# Patient Record
Sex: Female | Born: 1945
Health system: Southern US, Community
[De-identification: ages and names within clinical notes are randomized; demographics above are authoritative.]

## PROBLEM LIST (undated history)

## (undated) DIAGNOSIS — E0500 Thyrotoxicosis with diffuse goiter without thyrotoxic crisis or storm: Secondary | ICD-10-CM

## (undated) DIAGNOSIS — I10 Essential (primary) hypertension: Secondary | ICD-10-CM

## (undated) DIAGNOSIS — J45909 Unspecified asthma, uncomplicated: Secondary | ICD-10-CM

## (undated) DIAGNOSIS — E0590 Thyrotoxicosis, unspecified without thyrotoxic crisis or storm: Secondary | ICD-10-CM

## (undated) HISTORY — PX: HEMORROIDECTOMY: SUR656

## (undated) HISTORY — PX: TONSILLECTOMY: SUR1361

## (undated) HISTORY — PX: TONSILLECTOMY AND ADENOIDECTOMY: SHX28

## (undated) HISTORY — PX: EYE SURGERY: SHX253

---

## 1998-07-15 ENCOUNTER — Encounter: Payer: Self-pay | Admitting: Obstetrics and Gynecology

## 1998-07-15 ENCOUNTER — Ambulatory Visit (HOSPITAL_COMMUNITY): Admission: RE | Admit: 1998-07-15 | Discharge: 1998-07-15 | Payer: Self-pay | Admitting: Obstetrics and Gynecology

## 1998-10-15 ENCOUNTER — Other Ambulatory Visit: Admission: RE | Admit: 1998-10-15 | Discharge: 1998-10-15 | Payer: Self-pay | Admitting: Obstetrics and Gynecology

## 1999-08-15 ENCOUNTER — Ambulatory Visit (HOSPITAL_COMMUNITY): Admission: RE | Admit: 1999-08-15 | Discharge: 1999-08-15 | Payer: Self-pay | Admitting: Obstetrics and Gynecology

## 1999-08-15 ENCOUNTER — Encounter: Payer: Self-pay | Admitting: Obstetrics and Gynecology

## 2000-05-07 ENCOUNTER — Other Ambulatory Visit: Admission: RE | Admit: 2000-05-07 | Discharge: 2000-05-07 | Payer: Self-pay | Admitting: Obstetrics and Gynecology

## 2000-11-20 ENCOUNTER — Ambulatory Visit (HOSPITAL_COMMUNITY): Admission: RE | Admit: 2000-11-20 | Discharge: 2000-11-20 | Payer: Self-pay | Admitting: Obstetrics and Gynecology

## 2000-11-20 ENCOUNTER — Encounter: Payer: Self-pay | Admitting: Obstetrics and Gynecology

## 2000-11-23 ENCOUNTER — Encounter: Admission: RE | Admit: 2000-11-23 | Discharge: 2000-11-23 | Payer: Self-pay | Admitting: Obstetrics and Gynecology

## 2000-11-23 ENCOUNTER — Encounter: Payer: Self-pay | Admitting: Obstetrics and Gynecology

## 2001-11-22 ENCOUNTER — Encounter: Payer: Self-pay | Admitting: Obstetrics and Gynecology

## 2001-11-22 ENCOUNTER — Ambulatory Visit (HOSPITAL_COMMUNITY): Admission: RE | Admit: 2001-11-22 | Discharge: 2001-11-22 | Payer: Self-pay | Admitting: Obstetrics and Gynecology

## 2001-12-02 ENCOUNTER — Other Ambulatory Visit: Admission: RE | Admit: 2001-12-02 | Discharge: 2001-12-02 | Payer: Self-pay | Admitting: Obstetrics and Gynecology

## 2002-07-08 ENCOUNTER — Emergency Department (HOSPITAL_COMMUNITY): Admission: EM | Admit: 2002-07-08 | Discharge: 2002-07-08 | Payer: Self-pay | Admitting: Emergency Medicine

## 2002-07-08 ENCOUNTER — Encounter: Payer: Self-pay | Admitting: Emergency Medicine

## 2002-08-22 ENCOUNTER — Encounter: Payer: Self-pay | Admitting: Surgery

## 2002-08-23 ENCOUNTER — Inpatient Hospital Stay (HOSPITAL_COMMUNITY): Admission: RE | Admit: 2002-08-23 | Discharge: 2002-08-24 | Payer: Self-pay | Admitting: Surgery

## 2003-01-09 ENCOUNTER — Encounter: Payer: Self-pay | Admitting: Obstetrics and Gynecology

## 2003-01-09 ENCOUNTER — Ambulatory Visit (HOSPITAL_COMMUNITY): Admission: RE | Admit: 2003-01-09 | Discharge: 2003-01-09 | Payer: Self-pay | Admitting: Obstetrics and Gynecology

## 2003-06-28 ENCOUNTER — Emergency Department (HOSPITAL_COMMUNITY): Admission: EM | Admit: 2003-06-28 | Discharge: 2003-06-28 | Payer: Self-pay | Admitting: Emergency Medicine

## 2004-02-05 ENCOUNTER — Ambulatory Visit (HOSPITAL_COMMUNITY): Admission: RE | Admit: 2004-02-05 | Discharge: 2004-02-05 | Payer: Self-pay | Admitting: Obstetrics and Gynecology

## 2004-02-22 ENCOUNTER — Other Ambulatory Visit: Admission: RE | Admit: 2004-02-22 | Discharge: 2004-02-22 | Payer: Self-pay | Admitting: Obstetrics and Gynecology

## 2005-04-07 ENCOUNTER — Other Ambulatory Visit: Admission: RE | Admit: 2005-04-07 | Discharge: 2005-04-07 | Payer: Self-pay | Admitting: Obstetrics and Gynecology

## 2005-04-14 ENCOUNTER — Ambulatory Visit (HOSPITAL_COMMUNITY): Admission: RE | Admit: 2005-04-14 | Discharge: 2005-04-14 | Payer: Self-pay | Admitting: Obstetrics and Gynecology

## 2005-09-13 ENCOUNTER — Ambulatory Visit (HOSPITAL_COMMUNITY): Admission: RE | Admit: 2005-09-13 | Discharge: 2005-09-13 | Payer: Self-pay | Admitting: Internal Medicine

## 2006-07-08 ENCOUNTER — Emergency Department (HOSPITAL_COMMUNITY): Admission: EM | Admit: 2006-07-08 | Discharge: 2006-07-08 | Payer: Self-pay | Admitting: Family Medicine

## 2018-03-13 ENCOUNTER — Other Ambulatory Visit: Payer: Self-pay | Admitting: Family Medicine

## 2018-03-13 DIAGNOSIS — Z1231 Encounter for screening mammogram for malignant neoplasm of breast: Secondary | ICD-10-CM

## 2018-03-22 ENCOUNTER — Other Ambulatory Visit: Payer: Self-pay | Admitting: Family Medicine

## 2018-03-22 DIAGNOSIS — E2839 Other primary ovarian failure: Secondary | ICD-10-CM

## 2018-05-03 ENCOUNTER — Ambulatory Visit: Payer: Self-pay

## 2018-05-03 ENCOUNTER — Other Ambulatory Visit: Payer: Self-pay

## 2018-05-29 ENCOUNTER — Other Ambulatory Visit: Payer: Self-pay

## 2018-05-29 ENCOUNTER — Ambulatory Visit: Payer: Self-pay

## 2018-07-31 ENCOUNTER — Ambulatory Visit: Payer: Self-pay

## 2018-07-31 ENCOUNTER — Other Ambulatory Visit: Payer: Self-pay

## 2018-09-23 ENCOUNTER — Ambulatory Visit: Payer: Self-pay

## 2018-09-23 ENCOUNTER — Other Ambulatory Visit: Payer: Self-pay

## 2019-01-14 ENCOUNTER — Ambulatory Visit: Payer: Self-pay

## 2019-01-14 ENCOUNTER — Other Ambulatory Visit: Payer: Self-pay

## 2019-04-08 ENCOUNTER — Other Ambulatory Visit: Payer: Self-pay | Admitting: Family Medicine

## 2019-04-08 DIAGNOSIS — Z1231 Encounter for screening mammogram for malignant neoplasm of breast: Secondary | ICD-10-CM

## 2019-04-11 ENCOUNTER — Other Ambulatory Visit: Payer: Self-pay | Admitting: Family Medicine

## 2019-04-11 DIAGNOSIS — E2839 Other primary ovarian failure: Secondary | ICD-10-CM

## 2019-04-15 ENCOUNTER — Other Ambulatory Visit: Payer: Self-pay

## 2019-04-15 ENCOUNTER — Ambulatory Visit: Payer: Self-pay

## 2019-06-05 ENCOUNTER — Ambulatory Visit: Payer: Self-pay

## 2019-06-30 ENCOUNTER — Ambulatory Visit: Payer: Self-pay

## 2019-06-30 ENCOUNTER — Other Ambulatory Visit: Payer: Self-pay

## 2019-08-31 ENCOUNTER — Ambulatory Visit: Payer: Medicare Other | Attending: Internal Medicine

## 2019-08-31 DIAGNOSIS — Z23 Encounter for immunization: Secondary | ICD-10-CM | POA: Insufficient documentation

## 2019-08-31 NOTE — Progress Notes (Signed)
   Covid-19 Vaccination Clinic  Name:  Gina Bryan    MRN: [id number] DOB: Apr 19, 1946  08/31/2019  Gina Bryan was observed post Covid-19 immunization for 15 minutes without incidence. She was provided with Vaccine Information Sheet and instruction to access the V-Safe system.   Gina Bryan was instructed to call 911 with any severe reactions post vaccine: Marland Kitchen Difficulty breathing  . Swelling of your face and throat  . A fast heartbeat  . A bad rash all over your body  . Dizziness and weakness    Immunizations Administered    Name Date Dose VIS Date Route   Pfizer COVID-19 Vaccine 08/31/2019  1:29 PM 0.3 mL 06/27/2019 Intramuscular   Manufacturer: [place]   Lot: [id number]   [place]: [id number]

## 2019-09-04 ENCOUNTER — Ambulatory Visit: Payer: Self-pay

## 2019-09-08 ENCOUNTER — Ambulatory Visit: Payer: Self-pay

## 2019-09-23 ENCOUNTER — Ambulatory Visit: Payer: Medicare Other | Attending: Internal Medicine

## 2019-09-23 DIAGNOSIS — Z23 Encounter for immunization: Secondary | ICD-10-CM | POA: Insufficient documentation

## 2019-09-23 NOTE — Progress Notes (Signed)
   Covid-19 Vaccination Clinic  Name:  Gina Bryan    MRN: [id number] DOB: 10-13-1945  09/23/2019  Ms. Gina Bryan was observed post Covid-19 immunization for 15 minutes without incident. She was provided with Vaccine Information Sheet and instruction to access the V-Safe system.   Ms. Gina Bryan was instructed to call 911 with any severe reactions post vaccine: Marland Kitchen Difficulty breathing  . Swelling of face and throat  . A fast heartbeat  . A bad rash all over body  . Dizziness and weakness   Immunizations Administered    Name Date Dose VIS Date Route   Pfizer COVID-19 Vaccine 09/23/2019  1:27 PM 0.3 mL 06/27/2019 Intramuscular   Manufacturer: [place]   Lot: [id number]   [place]: [id number]

## 2019-09-24 ENCOUNTER — Ambulatory Visit: Payer: Self-pay

## 2019-09-24 ENCOUNTER — Other Ambulatory Visit: Payer: Self-pay

## 2019-12-11 ENCOUNTER — Other Ambulatory Visit: Payer: Self-pay

## 2019-12-11 ENCOUNTER — Ambulatory Visit
Admission: RE | Admit: 2019-12-11 | Discharge: 2019-12-11 | Disposition: A | Discharge status: Home / Self Care | Payer: Medicare Other | Source: Ambulatory Visit | Attending: Family Medicine | Admitting: Family Medicine

## 2019-12-11 DIAGNOSIS — Z1231 Encounter for screening mammogram for malignant neoplasm of breast: Secondary | ICD-10-CM

## 2019-12-11 DIAGNOSIS — E2839 Other primary ovarian failure: Secondary | ICD-10-CM

## 2020-02-03 ENCOUNTER — Other Ambulatory Visit: Payer: Self-pay | Admitting: Surgery

## 2020-02-25 NOTE — Patient Instructions (Addendum)
DUE TO COVID-19 ONLY ONE VISITOR IS ALLOWED TO COME WITH YOU AND STAY IN THE WAITING ROOM ONLY DURING PRE OP AND PROCEDURE DAY OF SURGERY. THE 1 VISITOR  MAY VISIT WITH YOU AFTER SURGERY IN YOUR PRIVATE ROOM DURING VISITING HOURS ONLY!  YOU NEED TO HAVE A COVID 19 TEST ON: 03/01/20 @ 2:00 pm , THIS TEST MUST BE DONE BEFORE SURGERY,  COVID TESTING SITE [place] [id number], IT IS ON THE RIGHT GOING OUT [address] APPROXIMATELY  2 MINUTES PAST [employer] ON THE RIGHT. ONCE YOUR COVID TEST IS COMPLETED,  PLEASE BEGIN THE QUARANTINE INSTRUCTIONS AS OUTLINED IN YOUR HANDOUT.                Assunta Curtis    Your procedure is scheduled on: 03/04/20   Report to [facility] Main  Entrance   Report to admitting at: 7:00 AM     Call this number if you have problems the morning of surgery [phone]    Remember:   DRINK 2 [place] AT  1000 PM AND 1 PRESURGERY DRINK THE DAY OF THE PROCEDURE 3 HOURS PRIOR TO SCHEDULED SURGERY. NO SOLIDS AFTER MIDNIGHT THE DAY PRIOR TO THE SURGERY. NOTHING BY MOUTH EXCEPT CLEAR LIQUIDS UNTIL THREE HOURS PRIOR TO SCHEDULED SURGERY. PLEASE FINISH PRESURGERY ENSURE DRINK PER SURGEON ORDER 3 HOURS PRIOR TO SCHEDULED SURGERY TIME WHICH NEEDS TO BE COMPLETED AT : 6:00 AM.  CLEAR LIQUID DIET   Foods Allowed                                                                     Foods Excluded  Coffee and tea, regular and decaf                             liquids that you cannot  Plain Jell-O any favor except red or purple                                           see through such as: Fruit ices (not with fruit pulp)                                     milk, soups, orange juice  Iced Popsicles                                    All solid food Carbonated beverages, regular and diet                                    Cranberry, grape and apple juices Sports drinks like Gatorade Lightly  seasoned clear broth or consume(fat free) Sugar, honey syrup  Sample Menu Breakfast  Lunch                                     Supper Cranberry juice                    Beef broth                            Chicken broth Jell-O                                     Grape juice                           Apple juice Coffee or tea                        Jell-O                                      Popsicle                                                Coffee or tea                        Coffee or tea  _____________________________________________________________________   BRUSH YOUR TEETH MORNING OF SURGERY AND RINSE YOUR MOUTH OUT, NO CHEWING GUM CANDY OR MINTS.     Take these medicines the morning of surgery with A SIP OF WATER: tapazole.                                You may not have any metal on your body including hair pins and              piercings  Do not wear jewelry, make-up, lotions, powders or perfumes, deodorant             Do not wear nail polish on your fingernails.  Do not shave  48 hours prior to surgery.            Do not bring valuables to the hospital. [place].  Contacts, dentures or bridgework may not be worn into surgery.  Leave suitcase in the car. After surgery it may be brought to your room.     Patients discharged the day of surgery will not be allowed to drive home. IF YOU ARE HAVING SURGERY AND GOING HOME THE SAME DAY, YOU MUST HAVE AN ADULT TO DRIVE YOU HOME AND BE WITH YOU FOR 24 HOURS. YOU MAY GO HOME BY TAXI OR UBER OR ORTHERWISE, BUT AN ADULT MUST ACCOMPANY YOU HOME AND STAY WITH YOU FOR 24 HOURS.  Name and phone number of your driver:  Special Instructions: N/A              Please read over the following fact sheets  you were given: _____________________________________________________________________       [facility] - Preparing for Surgery Before surgery, you can play  an important role.  Because skin is not sterile, your skin needs to be as free of germs as possible.  You can reduce the number of germs on your skin by washing with CHG (chlorahexidine gluconate) soap before surgery.  CHG is an antiseptic cleaner which kills germs and bonds with the skin to continue killing germs even after washing. Please DO NOT use if you have an allergy to CHG or antibacterial soaps.  If your skin becomes reddened/irritated stop using the CHG and inform your nurse when you arrive at Short Stay. Do not shave (including legs and underarms) for at least 48 hours prior to the first CHG shower.  You may shave your face/neck. Please follow these instructions carefully:  1.  Shower with CHG Soap the night before surgery and the  morning of Surgery.  2.  If you choose to wash your hair, wash your hair first as usual with your  normal  shampoo.  3.  After you shampoo, rinse your hair and body thoroughly to remove the  shampoo.                           4.  Use CHG as you would any other liquid soap.  You can apply chg directly  to the skin and wash                       Gently with a scrungie or clean washcloth.  5.  Apply the CHG Soap to your body ONLY FROM THE NECK DOWN.   Do not use on face/ open                           Wound or open sores. Avoid contact with eyes, ears mouth and genitals (private parts).                       Wash face,  Genitals (private parts) with your normal soap.             6.  Wash thoroughly, paying special attention to the area where your surgery  will be performed.  7.  Thoroughly rinse your body with warm water from the neck down.  8.  DO NOT shower/wash with your normal soap after using and rinsing off  the CHG Soap.                9.  Pat yourself dry with a clean towel.            10.  Wear clean pajamas.            11.  Place clean sheets on your bed the night of your first shower and do not  sleep with pets. Day of Surgery : Do not apply any  lotions/deodorants the morning of surgery.  Please wear clean clothes to the hospital/surgery center.  FAILURE TO FOLLOW THESE INSTRUCTIONS MAY RESULT IN THE CANCELLATION OF YOUR SURGERY PATIENT SIGNATURE_________________________________  NURSE SIGNATURE__________________________________  ________________________________________________________________________   Adam Phenix  An incentive spirometer is a tool that can help keep your lungs clear and active. This tool measures how well you are filling your lungs with each breath. Taking long deep breaths may help reverse or decrease the chance of developing breathing (  pulmonary) problems (especially infection) following:  A long period of time when you are unable to move or be active. BEFORE THE PROCEDURE   If the spirometer includes an indicator to show your best effort, your nurse or respiratory therapist will set it to a desired goal.  If possible, sit up straight or lean slightly forward. Try not to slouch.  Hold the incentive spirometer in an upright position. INSTRUCTIONS FOR USE  1. Sit on the edge of your bed if possible, or sit up as far as you can in bed or on a chair. 2. Hold the incentive spirometer in an upright position. 3. Breathe out normally. 4. Place the mouthpiece in your mouth and seal your lips tightly around it. 5. Breathe in slowly and as deeply as possible, raising the piston or the ball toward the top of the column. 6. Hold your breath for 3-5 seconds or for as long as possible. Allow the piston or ball to fall to the bottom of the column. 7. Remove the mouthpiece from your mouth and breathe out normally. 8. Rest for a few seconds and repeat Steps 1 through 7 at least 10 times every 1-2 hours when you are awake. Take your time and take a few normal breaths between deep breaths. 9. The spirometer may include an indicator to show your best effort. Use the indicator as a goal to work toward during each  repetition. 10. After each set of 10 deep breaths, practice coughing to be sure your lungs are clear. If you have an incision (the cut made at the time of surgery), support your incision when coughing by placing a pillow or rolled up towels firmly against it. Once you are able to get out of bed, walk around indoors and cough well. You may stop using the incentive spirometer when instructed by your caregiver.  RISKS AND COMPLICATIONS  Take your time so you do not get dizzy or light-headed.  If you are in pain, you may need to take or ask for pain medication before doing incentive spirometry. It is harder to take a deep breath if you are having pain. AFTER USE  Rest and breathe slowly and easily.  It can be helpful to keep track of a log of your progress. Your caregiver can provide you with a simple table to help with this. If you are using the spirometer at home, follow these instructions: [place] IF:   You are having difficultly using the spirometer.  You have trouble using the spirometer as often as instructed.  Your pain medication is not giving enough relief while using the spirometer.  You develop fever of 100.5 F (38.1 C) or higher. SEEK IMMEDIATE MEDICAL CARE IF:   You cough up bloody sputum that had not been present before.  You develop fever of 102 F (38.9 C) or greater.  You develop worsening pain at or near the incision site. MAKE SURE YOU:   Understand these instructions.  Will watch your condition.  Will get help right away if you are not doing well or get worse. Document Released: 11/13/2006 Document Revised: 09/25/2011 Document Reviewed: 01/14/2007 [facility] Patient Information 2014 [place], [place].   ________________________________________________________________________

## 2020-02-26 ENCOUNTER — Encounter (HOSPITAL_COMMUNITY): Payer: Self-pay

## 2020-02-26 ENCOUNTER — Other Ambulatory Visit: Payer: Self-pay

## 2020-02-26 ENCOUNTER — Encounter (HOSPITAL_COMMUNITY)
Admission: RE | Admit: 2020-02-26 | Discharge: 2020-02-26 | Disposition: A | Discharge status: Home / Self Care | Payer: Medicare Other | Source: Ambulatory Visit | Attending: Surgery | Admitting: Surgery

## 2020-02-26 DIAGNOSIS — Z01818 Encounter for other preprocedural examination: Secondary | ICD-10-CM | POA: Diagnosis present

## 2020-02-26 HISTORY — DX: Thyrotoxicosis, unspecified without thyrotoxic crisis or storm: E05.90

## 2020-02-26 HISTORY — DX: Thyrotoxicosis with diffuse goiter without thyrotoxic crisis or storm: E05.00

## 2020-02-26 HISTORY — DX: Unspecified asthma, uncomplicated: J45.909

## 2020-02-26 HISTORY — DX: Essential (primary) hypertension: I10

## 2020-02-26 LAB — CBC
HCT: 36.9 % (ref 36.0–46.0)
Hemoglobin: 11.7 g/dL — ABNORMAL LOW (ref 12.0–15.0)
MCH: 28.2 pg (ref 26.0–34.0)
MCHC: 31.7 g/dL (ref 30.0–36.0)
MCV: 88.9 fL (ref 80.0–100.0)
Platelets: 426 10*3/uL — ABNORMAL HIGH (ref 150–400)
RBC: 4.15 MIL/uL (ref 3.87–5.11)
RDW: 14 % (ref 11.5–15.5)
WBC: 4.7 10*3/uL (ref 4.0–10.5)
nRBC: 0 % (ref 0.0–0.2)

## 2020-02-26 LAB — BASIC METABOLIC PANEL
Anion gap: 8 (ref 5–15)
BUN: 29 mg/dL — ABNORMAL HIGH (ref 8–23)
CO2: 27 mmol/L (ref 22–32)
Calcium: 9.6 mg/dL (ref 8.9–10.3)
Chloride: 103 mmol/L (ref 98–111)
Creatinine, Ser: 1.15 mg/dL — ABNORMAL HIGH (ref 0.44–1.00)
GFR calc Af Amer: 55 mL/min — ABNORMAL LOW (ref >60)
GFR calc non Af Amer: 47 mL/min — ABNORMAL LOW (ref >60)
Glucose, Bld: 113 mg/dL — ABNORMAL HIGH (ref 70–99)
Potassium: 3.7 mmol/L (ref 3.5–5.1)
Sodium: 138 mmol/L (ref 135–145)

## 2020-02-26 LAB — HEMOGLOBIN A1C
Hgb A1c MFr Bld: 6.2 % — ABNORMAL HIGH (ref 4.8–5.6)
Mean Plasma Glucose: 131.24 mg/dL

## 2020-02-26 NOTE — Progress Notes (Signed)
COVID Vaccine Completed:yes Date COVID Vaccine completed:09/23/19 COVID vaccine manufacturer: *Pfizer    Moderna   [employer]'s   PCP - Dr. Suzanna Obey Cardiologist -   Chest x-ray -  EKG -  Stress Test -  ECHO -  Cardiac Cath -   Sleep Study -  CPAP -   Fasting Blood Sugar -  Checks Blood Sugar _____ times a day  Blood Thinner Instructions: Aspirin Instructions: Last Dose:  Anesthesia review:   Patient denies shortness of breath, fever, cough and chest pain at PAT appointment   Patient verbalized understanding of instructions that were given to them at the PAT appointment. Patient was also instructed that they will need to review over the PAT instructions again at home before surgery.

## 2020-03-01 ENCOUNTER — Other Ambulatory Visit (HOSPITAL_COMMUNITY)
Admission: RE | Admit: 2020-03-01 | Discharge: 2020-03-01 | Disposition: A | Discharge status: Home / Self Care | Payer: Medicare Other | Source: Ambulatory Visit | Attending: Surgery | Admitting: Surgery

## 2020-03-01 DIAGNOSIS — Z20822 Contact with and (suspected) exposure to covid-19: Secondary | ICD-10-CM | POA: Insufficient documentation

## 2020-03-01 DIAGNOSIS — Z01812 Encounter for preprocedural laboratory examination: Secondary | ICD-10-CM | POA: Insufficient documentation

## 2020-03-01 LAB — SARS CORONAVIRUS 2 (TAT 6-24 HRS): SARS Coronavirus 2: NEGATIVE

## 2020-03-03 NOTE — Anesthesia Preprocedure Evaluation (Addendum)
Anesthesia Evaluation  Patient identified by MRN, date of birth, ID band Patient awake    Reviewed: Allergy & Precautions, NPO status , Patient's Chart, lab work & pertinent test results  Airway Mallampati: II  TM Distance: >3 FB Neck ROM: Full    Dental no notable dental hx. (+) Teeth Intact, Dental Advisory Given   Pulmonary neg pulmonary ROS,    Pulmonary exam normal breath sounds clear to auscultation       Cardiovascular hypertension, Pt. on medications Normal cardiovascular exam Rhythm:Regular Rate:Normal     Neuro/Psych negative neurological ROS  negative psych ROS   GI/Hepatic negative GI ROS, Neg liver ROS,   Endo/Other    Renal/GU negative Renal ROSK+ 3.7 Cr 1.15     Musculoskeletal negative musculoskeletal ROS (+)   Abdominal   Peds  Hematology Hgb 11.7   Anesthesia Other Findings   Reproductive/Obstetrics negative OB ROS                            Anesthesia Physical Anesthesia Plan  ASA: II  Anesthesia Plan: General   Post-op Pain Management:    Induction: Intravenous  PONV Risk Score and Plan: 4 or greater and Treatment may vary due to age or medical condition, Ondansetron and Dexamethasone  Airway Management Planned: Oral ETT  Additional Equipment: None  Intra-op Plan:   Post-operative Plan: Extubation in OR  Informed Consent: I have reviewed the patients History and Physical, chart, labs and discussed the procedure including the risks, benefits and alternatives for the proposed anesthesia with the patient or authorized representative who has indicated his/her understanding and acceptance.     Dental advisory given  Plan Discussed with: CRNA and Anesthesiologist  Anesthesia Plan Comments:        Anesthesia Quick Evaluation

## 2020-03-03 NOTE — H&P (Signed)
Gina Bryan Appointment: 02/03/2020 11:30 AM Location: [facility] Patient #: [id number] DOB: 06-07-1946 Widowed / Language: [place] / Race: Black or African American Female   History of Present Illness (Gina Bryan A. [name] MD; 02/03/2020 11:34 AM) The patient is a 74 year old female who presents with a colonic polyp.  Chief complaint: Tubulovillous adenoma of the colon  This is a 74 year old female who was referred by Dr. Paulita Fujita recent diagnosis of a tubulovillous adenoma of the colon. She had not had a previous colonoscopy. She was found to have a tiny benign polyp which was completely removed in the transverse colon but a larger 3 cm x 3 cm polyp in the cecum. The biopsy on this showed a tubular villous adenoma. There was no high-grade dysplasia. Again, this was her first colonoscopy. There is no family history of colon cancer. She is otherwise healthy without complaints. Bowel movements have been normal. She denies abdominal pain. She has no cardiopulmonary issues. She has had a previous cholecystectomy and hysterectomy   Past Surgical History Gina Bryan, CMA; 02/03/2020 11:23 AM) Cataract Surgery  Bilateral. Colon Polyp Removal - Colonoscopy  Colon Removal - Complete  Hemorrhoidectomy  Hysterectomy (not due to cancer) - Complete  Oral Surgery   Diagnostic Studies History Gina Bryan, CMA; 02/03/2020 11:23 AM) Colonoscopy  within last year Mammogram  1-3 years ago Pap Smear  >5 years ago  Allergies Gina Bryan, CMA; 02/03/2020 11:24 AM) Naproxen *ANALGESICS - ANTI-INFLAMMATORY*  Hives. Ibuprofen *ANALGESICS - ANTI-INFLAMMATORY*   Medication History (Chemira Jones, CMA; 02/03/2020 11:24 AM) Lisinopril-hydroCHLOROthiazide (20-12.5MG  Tablet, Oral) Active. methIMAzole (5MG  Tablet, Oral) Active. Medications Reconciled  Social History Gina Bryan, CMA; 02/03/2020 11:23 AM) Alcohol use  Remotely quit alcohol use. Caffeine use   Coffee. No drug use  Tobacco use  Never smoker.  Family History Gina Bryan, CMA; 02/03/2020 11:23 AM) Arthritis  Brother, Mother, Son. Diabetes Mellitus  Mother. Hypertension  Brother, Daughter. Migraine Headache  Daughter. Ovarian Cancer  Mother. Prostate Cancer  Father. Thyroid problems  Mother.  Pregnancy / Birth History Gina Bryan, CMA; 02/03/2020 11:23 AM) Age at menarche  67 years. Age of menopause  51-55 Gravida  2 Maternal age  75-30 Para  2  Other Problems Gina Bryan, CMA; 02/03/2020 11:23 AM) Hemorrhoids  High blood pressure  Other disease, cancer, significant illness  Thyroid Disease     Review of Systems Gina Bryan CMA; 02/03/2020 11:23 AM) General Not Present- Appetite Loss, Chills, Fatigue, Fever, Night Sweats, Weight Gain and Weight Loss. Skin Not Present- Change in Wart/Mole, Dryness, Hives, Jaundice, New Lesions, Non-Healing Wounds, Rash and Ulcer. HEENT Not Present- Earache, Hearing Loss, Hoarseness, Nose Bleed, Oral Ulcers, Ringing in the Ears, Seasonal Allergies, Sinus Pain, Sore Throat, Visual Disturbances, Wears glasses/contact lenses and Yellow Eyes. Respiratory Not Present- Bloody sputum, Chronic Cough, Difficulty Breathing, Snoring and Wheezing. Breast Not Present- Breast Mass, Breast Pain, Nipple Discharge and Skin Changes. Cardiovascular Not Present- Chest Pain, Difficulty Breathing Lying Down, Leg Cramps, Palpitations, Rapid Heart Rate, Shortness of Breath and Swelling of Extremities. Gastrointestinal Present- Hemorrhoids. Not Present- Abdominal Pain, Bloating, Bloody Stool, Change in Bowel Habits, Chronic diarrhea, Constipation, Difficulty Swallowing, Excessive gas, Gets full quickly at meals, Indigestion, Nausea, Rectal Pain and Vomiting. Female Genitourinary Not Present- Frequency, Nocturia, Painful Urination, Pelvic Pain and Urgency. Musculoskeletal Not Present- Back Pain, Joint Pain, Joint Stiffness, Muscle Pain,  Muscle Weakness and Swelling of Extremities. Neurological Not Present- Decreased Memory, Fainting, Headaches, Numbness, Seizures, Tingling, Tremor, Trouble walking and Weakness. Psychiatric  Not Present- Anxiety, Bipolar, Change in Sleep Pattern, Depression, Fearful and Frequent crying. Endocrine Present- Heat Intolerance. Not Present- Cold Intolerance, Excessive Hunger, Hair Changes, Hot flashes and New Diabetes. Hematology Present- Gland problems. Not Present- Blood Thinners, Easy Bruising, Excessive bleeding, HIV and Persistent Infections.  Vitals (Chemira Jones CMA; 02/03/2020 11:23 AM) 02/03/2020 11:23 AM Weight: 164.8 lb Height: 66in Body Surface Area: 1.84 m Body Mass Index: 26.6 kg/m  BP: 118/80(Sitting, Left Arm, Standard)       Physical Exam ([place] A. [name] MD; 02/03/2020 11:34 AM) The physical exam findings are as follows: Note: She appears well on exam.  Her abdomen is soft and nontender. There is no organomegaly. There is no enlargement of liver. There is no adenopathy.    Assessment & Plan ([name] A. [name] MD; 02/03/2020 11:36 AM) TUBULOVILLOUS ADENOMA OF COLON (D12.6) Impression: I reviewed her notes from the gastroenterologist. I reviewed the images from her endoscopy. I've also reviewed her pathology results. This showed a tubulovillous adenoma in the cecum. There was no high-grade dysplasia or evidence of invasive cancer. This was too large to be removed endoscopically. I discussed the diagnosis with her in detail. It is recommended that she undergo a laparoscopic assisted partial colectomy to remove this area of the colon for complete histologic evaluation to rule out malignancy. This is also to prevent any bleeding from the polyp, to keep it from developing into a cancer, or to cause obstruction. I gave her literature regarding surgical removal of the colon. We discussed the diagnosis and the surgical procedure in detail. I discussed the risks of surgery  which includes but is not limited to bleeding, infection, injury to surrounding structures, the need to convert to an open procedure, anastomotic leak, the need for further procedures, artery or pulmonary issues, postoperative recovery, etc. She understands and wished to proceed with surgery which will be scheduled.

## 2020-03-04 ENCOUNTER — Encounter (HOSPITAL_COMMUNITY)
Admission: RE | Disposition: A | Discharge status: Home / Self Care | Payer: Self-pay | Source: Other Acute Inpatient Hospital | Attending: Surgery

## 2020-03-04 ENCOUNTER — Inpatient Hospital Stay (HOSPITAL_COMMUNITY): Payer: Medicare Other | Admitting: Certified Registered Nurse Anesthetist

## 2020-03-04 ENCOUNTER — Other Ambulatory Visit: Payer: Self-pay

## 2020-03-04 ENCOUNTER — Inpatient Hospital Stay (HOSPITAL_COMMUNITY)
Admission: RE | Admit: 2020-03-04 | Discharge: 2020-03-09 | DRG: 330 | Disposition: A | Discharge status: Home / Self Care | Payer: Medicare Other | Attending: Surgery | Admitting: Surgery

## 2020-03-04 ENCOUNTER — Encounter (HOSPITAL_COMMUNITY): Payer: Self-pay | Admitting: Surgery

## 2020-03-04 DIAGNOSIS — Z886 Allergy status to analgesic agent status: Secondary | ICD-10-CM

## 2020-03-04 DIAGNOSIS — N994 Postprocedural pelvic peritoneal adhesions: Secondary | ICD-10-CM | POA: Diagnosis present

## 2020-03-04 DIAGNOSIS — D62 Acute posthemorrhagic anemia: Secondary | ICD-10-CM | POA: Diagnosis not present

## 2020-03-04 DIAGNOSIS — Z20822 Contact with and (suspected) exposure to covid-19: Secondary | ICD-10-CM | POA: Diagnosis present

## 2020-03-04 DIAGNOSIS — Z833 Family history of diabetes mellitus: Secondary | ICD-10-CM | POA: Diagnosis not present

## 2020-03-04 DIAGNOSIS — Z8041 Family history of malignant neoplasm of ovary: Secondary | ICD-10-CM

## 2020-03-04 DIAGNOSIS — I10 Essential (primary) hypertension: Secondary | ICD-10-CM | POA: Diagnosis present

## 2020-03-04 DIAGNOSIS — Z8249 Family history of ischemic heart disease and other diseases of the circulatory system: Secondary | ICD-10-CM | POA: Diagnosis not present

## 2020-03-04 DIAGNOSIS — E079 Disorder of thyroid, unspecified: Secondary | ICD-10-CM | POA: Diagnosis present

## 2020-03-04 DIAGNOSIS — Z8261 Family history of arthritis: Secondary | ICD-10-CM

## 2020-03-04 DIAGNOSIS — Z79899 Other long term (current) drug therapy: Secondary | ICD-10-CM

## 2020-03-04 DIAGNOSIS — D374 Neoplasm of uncertain behavior of colon: Secondary | ICD-10-CM | POA: Diagnosis present

## 2020-03-04 DIAGNOSIS — Z90710 Acquired absence of both cervix and uterus: Secondary | ICD-10-CM | POA: Diagnosis not present

## 2020-03-04 DIAGNOSIS — D120 Benign neoplasm of cecum: Secondary | ICD-10-CM | POA: Diagnosis present

## 2020-03-04 HISTORY — PX: LAPAROSCOPIC PARTIAL COLECTOMY: SHX5907

## 2020-03-04 SURGERY — LAPAROSCOPIC PARTIAL COLECTOMY
Anesthesia: General | Site: Abdomen

## 2020-03-04 MED ORDER — FENTANYL CITRATE (PF) 100 MCG/2ML IJ SOLN: 25.0000 ug | INTRAMUSCULAR | Status: DC | PRN

## 2020-03-04 MED ORDER — DIPHENHYDRAMINE HCL 12.5 MG/5ML PO ELIX: 12.5000 mg | ORAL_SOLUTION | Freq: Four times a day (QID) | ORAL | Status: DC | PRN

## 2020-03-04 MED ORDER — LIDOCAINE 2% (20 MG/ML) 5 ML SYRINGE
INTRAMUSCULAR | Status: AC
  Filled 2020-03-04: qty 5

## 2020-03-04 MED ORDER — GABAPENTIN 100 MG PO CAPS
100.0000 mg | ORAL_CAPSULE | ORAL | Status: AC
  Filled 2020-03-04: qty 1

## 2020-03-04 MED ORDER — ONDANSETRON HCL 4 MG/2ML IJ SOLN
INTRAMUSCULAR | Status: DC | PRN
  Administered 2020-03-04: 4 mg via INTRAVENOUS

## 2020-03-04 MED ORDER — MIDAZOLAM HCL 2 MG/2ML IJ SOLN
INTRAMUSCULAR | Status: AC
  Filled 2020-03-04: qty 2

## 2020-03-04 MED ORDER — LIDOCAINE HCL (PF) 2 % IJ SOLN
INTRAMUSCULAR | Status: AC
  Filled 2020-03-04: qty 10

## 2020-03-04 MED ORDER — ENOXAPARIN SODIUM 40 MG/0.4ML ~~LOC~~ SOLN
SUBCUTANEOUS | Status: AC
  Administered 2020-03-04: 40 mg via SUBCUTANEOUS
  Filled 2020-03-04: qty 0.4

## 2020-03-04 MED ORDER — ENOXAPARIN SODIUM 40 MG/0.4ML ~~LOC~~ SOLN: 40.0000 mg | Freq: Once | SUBCUTANEOUS | Status: AC

## 2020-03-04 MED ORDER — MIDAZOLAM HCL 5 MG/5ML IJ SOLN
INTRAMUSCULAR | Status: DC | PRN
  Administered 2020-03-04: 1 mg via INTRAVENOUS

## 2020-03-04 MED ORDER — FENTANYL CITRATE (PF) 100 MCG/2ML IJ SOLN
INTRAMUSCULAR | Status: AC
Start: 2020-03-04 — End: ?
  Filled 2020-03-04: qty 2

## 2020-03-04 MED ORDER — ALVIMOPAN 12 MG PO CAPS
ORAL_CAPSULE | ORAL | Status: AC
  Administered 2020-03-04: 12 mg via ORAL
  Filled 2020-03-04: qty 1

## 2020-03-04 MED ORDER — ONDANSETRON HCL 4 MG/2ML IJ SOLN
INTRAMUSCULAR | Status: AC
  Filled 2020-03-04: qty 2

## 2020-03-04 MED ORDER — ACETAMINOPHEN 500 MG PO TABS: 1000.0000 mg | ORAL_TABLET | ORAL | Status: AC

## 2020-03-04 MED ORDER — PHENYLEPHRINE 40 MCG/ML (10ML) SYRINGE FOR IV PUSH (FOR BLOOD PRESSURE SUPPORT)
PREFILLED_SYRINGE | INTRAVENOUS | Status: DC | PRN
  Administered 2020-03-04: 80 ug via INTRAVENOUS
  Administered 2020-03-04: 120 ug via INTRAVENOUS
  Administered 2020-03-04 (×2): 80 ug via INTRAVENOUS

## 2020-03-04 MED ORDER — EPHEDRINE SULFATE-NACL 50-0.9 MG/10ML-% IV SOSY
PREFILLED_SYRINGE | INTRAVENOUS | Status: DC | PRN
  Administered 2020-03-04: 10 mg via INTRAVENOUS
  Administered 2020-03-04 (×2): 5 mg via INTRAVENOUS

## 2020-03-04 MED ORDER — SUGAMMADEX SODIUM 200 MG/2ML IV SOLN
INTRAVENOUS | Status: DC | PRN
  Administered 2020-03-04: 200 mg via INTRAVENOUS

## 2020-03-04 MED ORDER — SODIUM CHLORIDE 0.9 % IV SOLN
INTRAVENOUS | Status: AC
  Filled 2020-03-04: qty 2

## 2020-03-04 MED ORDER — ORAL CARE MOUTH RINSE: 15.0000 mL | Freq: Once | OROMUCOSAL | Status: AC

## 2020-03-04 MED ORDER — ONDANSETRON HCL 4 MG/2ML IJ SOLN
4.0000 mg | Freq: Four times a day (QID) | INTRAMUSCULAR | Status: DC | PRN
  Administered 2020-03-04: 4 mg via INTRAVENOUS

## 2020-03-04 MED ORDER — CHLORHEXIDINE GLUCONATE CLOTH 2 % EX PADS: 6.0000 | MEDICATED_PAD | Freq: Once | CUTANEOUS | Status: DC

## 2020-03-04 MED ORDER — POTASSIUM CHLORIDE IN NACL 20-0.9 MEQ/L-% IV SOLN
INTRAVENOUS | Status: DC
  Administered 2020-03-07: 100 mL/h via INTRAVENOUS
  Filled 2020-03-04 (×9): qty 1000

## 2020-03-04 MED ORDER — HYDROCHLOROTHIAZIDE 12.5 MG PO CAPS
12.5000 mg | ORAL_CAPSULE | Freq: Every evening | ORAL | Status: DC
  Administered 2020-03-04 – 2020-03-08 (×4): 12.5 mg via ORAL
  Filled 2020-03-04 (×4): qty 1

## 2020-03-04 MED ORDER — ALBUMIN HUMAN 5 % IV SOLN: 12.5000 g | Freq: Once | INTRAVENOUS | Status: AC

## 2020-03-04 MED ORDER — DIPHENHYDRAMINE HCL 50 MG/ML IJ SOLN: 12.5000 mg | Freq: Four times a day (QID) | INTRAMUSCULAR | Status: DC | PRN

## 2020-03-04 MED ORDER — SODIUM CHLORIDE 0.9 % IV SOLN
2.0000 g | INTRAVENOUS | Status: AC
  Administered 2020-03-04: 2 g via INTRAVENOUS

## 2020-03-04 MED ORDER — BUPIVACAINE HCL (PF) 0.5 % IJ SOLN
INTRAMUSCULAR | Status: AC
  Filled 2020-03-04: qty 30

## 2020-03-04 MED ORDER — GABAPENTIN 300 MG PO CAPS
ORAL_CAPSULE | ORAL | Status: AC
  Administered 2020-03-04: 100 mg via ORAL
  Filled 2020-03-04: qty 1

## 2020-03-04 MED ORDER — LISINOPRIL 20 MG PO TABS
20.0000 mg | ORAL_TABLET | Freq: Every evening | ORAL | Status: DC
  Administered 2020-03-04 – 2020-03-08 (×4): 20 mg via ORAL
  Filled 2020-03-04 (×4): qty 1

## 2020-03-04 MED ORDER — ENOXAPARIN SODIUM 40 MG/0.4ML ~~LOC~~ SOLN
40.0000 mg | SUBCUTANEOUS | Status: DC
  Administered 2020-03-05 – 2020-03-07 (×3): 40 mg via SUBCUTANEOUS
  Filled 2020-03-04 (×3): qty 0.4

## 2020-03-04 MED ORDER — SODIUM CHLORIDE 0.9 % IV SOLN
2.0000 g | Freq: Two times a day (BID) | INTRAVENOUS | Status: AC
  Administered 2020-03-04 – 2020-03-05 (×2): 2 g via INTRAVENOUS
  Filled 2020-03-04 (×2): qty 2

## 2020-03-04 MED ORDER — LIDOCAINE 2% (20 MG/ML) 5 ML SYRINGE
INTRAMUSCULAR | Status: DC | PRN
  Administered 2020-03-04: 1 mg/kg/h via INTRAVENOUS

## 2020-03-04 MED ORDER — ALBUMIN HUMAN 5 % IV SOLN
INTRAVENOUS | Status: AC
  Administered 2020-03-04: 12.5 g via INTRAVENOUS
  Filled 2020-03-04: qty 250

## 2020-03-04 MED ORDER — ENSURE PRE-SURGERY PO LIQD: 296.0000 mL | Freq: Once | ORAL | Status: DC

## 2020-03-04 MED ORDER — ROCURONIUM BROMIDE 10 MG/ML (PF) SYRINGE
PREFILLED_SYRINGE | INTRAVENOUS | Status: DC | PRN
  Administered 2020-03-04 (×2): 10 mg via INTRAVENOUS
  Administered 2020-03-04: 60 mg via INTRAVENOUS

## 2020-03-04 MED ORDER — ALVIMOPAN 12 MG PO CAPS: 12.0000 mg | ORAL_CAPSULE | ORAL | Status: AC

## 2020-03-04 MED ORDER — ONDANSETRON HCL 4 MG/2ML IJ SOLN: 4.0000 mg | Freq: Once | INTRAMUSCULAR | Status: DC | PRN

## 2020-03-04 MED ORDER — CHLORHEXIDINE GLUCONATE 0.12 % MT SOLN
15.0000 mL | Freq: Once | OROMUCOSAL | Status: AC
  Administered 2020-03-04: 15 mL via OROMUCOSAL

## 2020-03-04 MED ORDER — FENTANYL CITRATE (PF) 100 MCG/2ML IJ SOLN
INTRAMUSCULAR | Status: DC | PRN
Start: 2020-03-04 — End: 2020-03-04
  Administered 2020-03-04: 25 ug via INTRAVENOUS
  Administered 2020-03-04: 50 ug via INTRAVENOUS
  Administered 2020-03-04: 25 ug via INTRAVENOUS
  Administered 2020-03-04 (×2): 50 ug via INTRAVENOUS

## 2020-03-04 MED ORDER — LACTATED RINGERS IV SOLN: INTRAVENOUS | Status: DC

## 2020-03-04 MED ORDER — ACETAMINOPHEN 10 MG/ML IV SOLN: 1000.0000 mg | Freq: Once | INTRAVENOUS | Status: DC | PRN

## 2020-03-04 MED ORDER — PHENYLEPHRINE HCL-NACL 10-0.9 MG/250ML-% IV SOLN
INTRAVENOUS | Status: DC | PRN
  Administered 2020-03-04: 35 ug/min via INTRAVENOUS

## 2020-03-04 MED ORDER — ONDANSETRON 4 MG PO TBDP: 4.0000 mg | ORAL_TABLET | Freq: Four times a day (QID) | ORAL | Status: DC | PRN

## 2020-03-04 MED ORDER — DEXAMETHASONE SODIUM PHOSPHATE 10 MG/ML IJ SOLN
INTRAMUSCULAR | Status: DC | PRN
  Administered 2020-03-04: 6 mg via INTRAVENOUS

## 2020-03-04 MED ORDER — ACETAMINOPHEN 500 MG PO TABS
ORAL_TABLET | ORAL | Status: AC
  Administered 2020-03-04: 1000 mg via ORAL
  Filled 2020-03-04: qty 2

## 2020-03-04 MED ORDER — LISINOPRIL-HYDROCHLOROTHIAZIDE 20-12.5 MG PO TABS: 1.0000 | ORAL_TABLET | Freq: Every evening | ORAL | Status: DC

## 2020-03-04 MED ORDER — BUPIVACAINE HCL (PF) 0.5 % IJ SOLN
INTRAMUSCULAR | Status: DC | PRN
  Administered 2020-03-04: 20 mL

## 2020-03-04 MED ORDER — SODIUM CHLORIDE 0.9% FLUSH: 9.0000 mL | INTRAVENOUS | Status: DC | PRN

## 2020-03-04 MED ORDER — FENTANYL CITRATE (PF) 100 MCG/2ML IJ SOLN
INTRAMUSCULAR | Status: AC
  Filled 2020-03-04: qty 2

## 2020-03-04 MED ORDER — METHIMAZOLE 5 MG PO TABS
2.5000 mg | ORAL_TABLET | Freq: Every day | ORAL | Status: DC
  Administered 2020-03-06 – 2020-03-09 (×4): 2.5 mg via ORAL
  Filled 2020-03-04 (×4): qty 1

## 2020-03-04 MED ORDER — DEXAMETHASONE SODIUM PHOSPHATE 10 MG/ML IJ SOLN
INTRAMUSCULAR | Status: AC
  Filled 2020-03-04: qty 1

## 2020-03-04 MED ORDER — LIDOCAINE 2% (20 MG/ML) 5 ML SYRINGE
INTRAMUSCULAR | Status: DC | PRN
  Administered 2020-03-04: 40 mg via INTRAVENOUS

## 2020-03-04 MED ORDER — PROPOFOL 10 MG/ML IV BOLUS
INTRAVENOUS | Status: DC | PRN
  Administered 2020-03-04: 140 mg via INTRAVENOUS

## 2020-03-04 MED ORDER — BUPIVACAINE LIPOSOME 1.3 % IJ SUSP
20.0000 mL | Freq: Once | INTRAMUSCULAR | Status: AC
  Administered 2020-03-04: 20 mL
  Filled 2020-03-04: qty 20

## 2020-03-04 MED ORDER — FENTANYL 50 MCG/ML IV PCA SOLN
INTRAVENOUS | Status: DC
  Administered 2020-03-04: 40 ug via INTRAVENOUS
  Administered 2020-03-04: 0.2 mL via INTRAVENOUS
  Administered 2020-03-04: 0.8 mL via INTRAVENOUS
  Administered 2020-03-05: 20 ug/h via INTRAVENOUS
  Administered 2020-03-05: 30 ug via INTRAVENOUS
  Administered 2020-03-05: 0.6 mL via INTRAVENOUS
  Administered 2020-03-05 (×3): 20 ug via INTRAVENOUS
  Administered 2020-03-06 (×2): 30 ug via INTRAVENOUS
  Filled 2020-03-04: qty 20

## 2020-03-04 MED ORDER — ROCURONIUM BROMIDE 10 MG/ML (PF) SYRINGE
PREFILLED_SYRINGE | INTRAVENOUS | Status: AC
  Filled 2020-03-04: qty 10

## 2020-03-04 MED ORDER — ENSURE PRE-SURGERY PO LIQD: 592.0000 mL | Freq: Once | ORAL | Status: DC

## 2020-03-04 MED ORDER — PROPOFOL 10 MG/ML IV BOLUS
INTRAVENOUS | Status: AC
  Filled 2020-03-04: qty 20

## 2020-03-04 MED ORDER — ONDANSETRON HCL 4 MG/2ML IJ SOLN
4.0000 mg | Freq: Four times a day (QID) | INTRAMUSCULAR | Status: DC | PRN
  Filled 2020-03-04: qty 2

## 2020-03-04 MED ORDER — PHENYLEPHRINE HCL-NACL 10-0.9 MG/250ML-% IV SOLN
INTRAVENOUS | Status: AC
  Filled 2020-03-04: qty 250

## 2020-03-04 MED ORDER — NALOXONE HCL 0.4 MG/ML IJ SOLN: 0.4000 mg | INTRAMUSCULAR | Status: DC | PRN

## 2020-03-04 MED ORDER — 0.9 % SODIUM CHLORIDE (POUR BTL) OPTIME
TOPICAL | Status: DC | PRN
  Administered 2020-03-04: 4000 mL

## 2020-03-04 SURGICAL SUPPLY — 67 items
APPLIER CLIP 5 13 M/L LIGAMAX5 (MISCELLANEOUS)
APPLIER CLIP ROT 10 11.4 M/L (STAPLE)
BLADE EXTENDED COATED 6.5IN (ELECTRODE) ×2 IMPLANT
BLADE HEX COATED 2.75 (ELECTRODE) ×2 IMPLANT
CABLE HIGH FREQUENCY MONO STRZ (ELECTRODE) ×2 IMPLANT
CELLS DAT CNTRL 66122 CELL SVR (MISCELLANEOUS) ×1 IMPLANT
CLIP APPLIE 5 13 M/L LIGAMAX5 (MISCELLANEOUS) IMPLANT
CLIP APPLIE ROT 10 11.4 M/L (STAPLE) IMPLANT
COVER WAND RF STERILE (DRAPES) IMPLANT
DECANTER SPIKE VIAL GLASS SM (MISCELLANEOUS) ×2 IMPLANT
DERMABOND ADVANCED (GAUZE/BANDAGES/DRESSINGS) ×1
DERMABOND ADVANCED .7 DNX12 (GAUZE/BANDAGES/DRESSINGS) ×1 IMPLANT
DRAIN CHANNEL 19F RND (DRAIN) IMPLANT
DRAPE LAPAROSCOPIC ABDOMINAL (DRAPES) ×2 IMPLANT
DRSG COVADERM PLUS 2X2 (GAUZE/BANDAGES/DRESSINGS) ×2 IMPLANT
DRSG OPSITE POSTOP 4X10 (GAUZE/BANDAGES/DRESSINGS) ×2 IMPLANT
DRSG OPSITE POSTOP 4X6 (GAUZE/BANDAGES/DRESSINGS) IMPLANT
DRSG OPSITE POSTOP 4X8 (GAUZE/BANDAGES/DRESSINGS) IMPLANT
DRSG TEGADERM 2-3/8X2-3/4 SM (GAUZE/BANDAGES/DRESSINGS) ×2 IMPLANT
ELECT REM PT RETURN 15FT ADLT (MISCELLANEOUS) ×2 IMPLANT
EVACUATOR DRAINAGE 10X20 100CC (DRAIN) IMPLANT
EVACUATOR SILICONE 100CC (DRAIN)
GAUZE SPONGE 4X4 12PLY STRL (GAUZE/BANDAGES/DRESSINGS) ×2 IMPLANT
GOWN STRL REUS W/TWL LRG LVL3 (GOWN DISPOSABLE) ×2 IMPLANT
GOWN STRL REUS W/TWL XL LVL3 (GOWN DISPOSABLE) ×4 IMPLANT
KIT TURNOVER KIT A (KITS) IMPLANT
LEGGING LITHOTOMY PAIR STRL (DRAPES) ×2 IMPLANT
LIGASURE IMPACT 36 18CM CVD LR (INSTRUMENTS) ×2 IMPLANT
NS IRRIG 1000ML POUR BTL (IV SOLUTION) ×4 IMPLANT
PACK COLON (CUSTOM PROCEDURE TRAY) ×2 IMPLANT
PENCIL SMOKE EVACUATOR (MISCELLANEOUS) IMPLANT
PROTECTOR NERVE ULNAR (MISCELLANEOUS) IMPLANT
RELOAD PROXIMATE 75MM BLUE (ENDOMECHANICALS) ×8 IMPLANT
RTRCTR WOUND ALEXIS 18CM MED (MISCELLANEOUS) ×2
SCISSORS LAP 5X35 DISP (ENDOMECHANICALS) ×2 IMPLANT
SEALER TISSUE G2 CVD JAW 45CM (ENDOMECHANICALS) ×2 IMPLANT
SET IRRIG TUBING LAPAROSCOPIC (IRRIGATION / IRRIGATOR) ×2 IMPLANT
SET TUBE SMOKE EVAC HIGH FLOW (TUBING) ×2 IMPLANT
SHEARS HARMONIC ACE PLUS 36CM (ENDOMECHANICALS) ×2 IMPLANT
SOL ANTI FOG 6CC (MISCELLANEOUS) ×1 IMPLANT
SOLUTION ANTI FOG 6CC (MISCELLANEOUS) ×1
SPONGE LAP 18X18 RF (DISPOSABLE) ×8 IMPLANT
STAPLER GUN LINEAR PROX 60 (STAPLE) ×2 IMPLANT
STAPLER PROXIMATE 75MM BLUE (STAPLE) ×2 IMPLANT
STAPLER VISISTAT 35W (STAPLE) ×2 IMPLANT
SURGILUBE 2OZ TUBE FLIPTOP (MISCELLANEOUS) IMPLANT
SUT PDS AB 1 CTX 36 (SUTURE) IMPLANT
SUT PDS AB 1 TP1 96 (SUTURE) ×4 IMPLANT
SUT PROLENE 0 CT 2 (SUTURE) IMPLANT
SUT PROLENE 2 0 KS (SUTURE) IMPLANT
SUT SILK 2 0 (SUTURE) ×2
SUT SILK 2 0 SH CR/8 (SUTURE) ×6 IMPLANT
SUT SILK 2-0 18XBRD TIE 12 (SUTURE) ×1 IMPLANT
SUT SILK 3 0 (SUTURE) ×2
SUT SILK 3 0 SH CR/8 (SUTURE) ×4 IMPLANT
SUT SILK 3-0 18XBRD TIE 12 (SUTURE) ×1 IMPLANT
SUT VIC AB 2-0 SH 18 (SUTURE) IMPLANT
SUT VICRYL 2 0 18  UND BR (SUTURE)
SUT VICRYL 2 0 18 UND BR (SUTURE) IMPLANT
SYS LAPSCP GELPORT 120MM (MISCELLANEOUS)
SYSTEM LAPSCP GELPORT 120MM (MISCELLANEOUS) IMPLANT
TRAY FOLEY MTR SLVR 16FR STAT (SET/KITS/TRAYS/PACK) ×2 IMPLANT
TROCAR XCEL BLUNT TIP 100MML (ENDOMECHANICALS) IMPLANT
TROCAR XCEL NON-BLD 11X100MML (ENDOMECHANICALS) IMPLANT
TROCAR XCEL UNIV SLVE 11M 100M (ENDOMECHANICALS) IMPLANT
TUBING CONNECTING 10 (TUBING) ×4 IMPLANT
YANKAUER SUCT BULB TIP NO VENT (SUCTIONS) ×2 IMPLANT

## 2020-03-04 NOTE — Anesthesia Procedure Notes (Signed)
Procedure Name: Intubation Date/Time: 03/04/2020 9:21 AM Performed by: West Pugh, CRNA Pre-anesthesia Checklist: Patient identified, Emergency Drugs available, Suction available, Patient being monitored and Timeout performed Patient Re-evaluated:Patient Re-evaluated prior to induction Oxygen Delivery Method: Circle system utilized Preoxygenation: Pre-oxygenation with 100% oxygen Induction Type: IV induction Ventilation: Mask ventilation without difficulty Laryngoscope Size: Mac and 3 Grade View: Grade I Tube type: Oral Tube size: 7.0 mm Number of attempts: 1 Airway Equipment and Method: Stylet Placement Confirmation: ETT inserted through vocal cords under direct vision,  breath sounds checked- equal and bilateral and positive ETCO2 Secured at: 21 cm Tube secured with: Tape Dental Injury: Teeth and Oropharynx as per pre-operative assessment

## 2020-03-04 NOTE — Interval H&P Note (Signed)
History and Physical Interval Note:no change in H and P  03/04/2020 8:43 AM  Gina Bryan  has presented today for surgery, with the diagnosis of TUBULOVILLOUS ADENOMA OF COLON.  The various methods of treatment have been discussed with the patient and family. After consideration of risks, benefits and other options for treatment, the patient has consented to  Procedure(s): LAPAROSCOPIC ASISTED PARTIAL COLECTOMY (N/A) as a surgical intervention.  The patient's history has been reviewed, patient examined, no change in status, stable for surgery.  I have reviewed the patient's chart and labs.  Questions were answered to the patient's satisfaction.     Coralie Keens

## 2020-03-04 NOTE — Anesthesia Postprocedure Evaluation (Signed)
Anesthesia Post Note  Patient: Assunta Curtis  Procedure(s) Performed: LAPAROSCOPIC ASISTED PARTIAL COLECTOMY (N/A Abdomen)     Patient location during evaluation: PACU Anesthesia Type: General Level of consciousness: awake and alert Pain management: pain level controlled Vital Signs Assessment: post-procedure vital signs reviewed and stable Respiratory status: spontaneous breathing, nonlabored ventilation, respiratory function stable and patient connected to nasal cannula oxygen Cardiovascular status: blood pressure returned to baseline and stable Postop Assessment: no apparent nausea or vomiting Anesthetic complications: no   No complications documented.  Last Vitals:  Vitals:   03/04/20 1413 03/04/20 1457  BP: 134/77 (!) 142/82  Pulse: 82 85  Resp: 15 14  Temp: 36.4 C 36.4 C  SpO2: 100% 100%    Last Pain:  Vitals:   03/04/20 1457  TempSrc: Oral  PainSc:                  Barnet Glasgow

## 2020-03-04 NOTE — Transfer of Care (Signed)
Immediate Anesthesia Transfer of Care Note  Patient: Gina Bryan  Procedure(s) Performed: LAPAROSCOPIC ASISTED PARTIAL COLECTOMY (N/A Abdomen)  Patient Location: PACU  Anesthesia Type:General  Level of Consciousness: awake, drowsy and patient cooperative  Airway & Oxygen Therapy: Patient Spontanous Breathing and Patient connected to face mask oxygen  Post-op Assessment: Report given to RN and Post -op Vital signs reviewed and stable  Post vital signs: Reviewed and stable  Last Vitals:  Vitals Value Taken Time  BP 86/66 03/04/20 1209  Temp    Pulse 76 03/04/20 1211  Resp 9 03/04/20 1211  SpO2 100 % 03/04/20 1211  Vitals shown include unvalidated device data.  Last Pain:  Vitals:   03/04/20 0723  TempSrc:   PainSc: 0-No pain         Complications: No complications documented.

## 2020-03-04 NOTE — Op Note (Signed)
Gina Bryan 03/04/2020   Pre-op Diagnosis: TUBULOVILLOUS ADENOMA OF COLON     Post-op Diagnosis: same  Procedure(s): LAPAROSCOPIC CONVERTED TO OPEN RIGHT ILE0COLECTOMY LYSIS OF ADHESIONS REPAIR OF JEJUNAL ENTEROTOMY  Surgeon(s): Coralie Keens, MD  Anesthesia: General  Staff:  Circulator: Bartholomew Boards, RN Scrub Person: Lucy Antigua RN First Assistant: Ignacia Palma, RN; Alena Bills, RN  Estimated Blood Loss: 300 mL               Specimens: SENT TO PATH  Indications: This is a 74 year old female was found to have a 3 x 3 cm tubulovillous adenoma in the cecum.  This could not be removed via colonoscopy.  The decision was made to proceed with a partial colectomy.  Findings: The abdomen was easily palpable in the cecum.  The patient had extensive adhesions in the pelvis from her previous hysterectomy with small bowel fixed in multiple locations.  During placement of the Optiview camera, a bowel injury was suspected.  When the procedure was converted to an open procedure a small enterotomy in the jejunum just distal to ligament of Treitz was identified and suture repaired  see an obvious bowel injury at this point.          Coralie Keens   Date: 03/04/2020  Time: 11:53 AM    Procedure: Patient was brought to the operating room and identifies correct patient.  She is placed upon the operating table general anesthesia was induced.  Her abdomen was then prepped and draped in usual sterile fashion.  Made a small incision the patient's left upper quadrant with a scalpel.  I then used the 5 mm trocar and Optiview camera to slowly traversed the layers of the abdominal wall.  Peritoneum was quite thickened and it was difficult to place the Optiview camera after placing it it was noticed to be underneath the omentum.  I could not see a bowel injury at this point.  I placed a 5 mm trocar at the patient's midline just below the umbilicus under direct vision.  I  visualized the left upper quadrant and again could see what appeared to be a small opening in small bowel mesentery but again no bowel injury.  At this point I placed another 5 mm trocar in the patient's left lower quadrant under direct vision.  I was able to identify the right colon and hepatic flexure.  The patient had a moderate adhesions from the hepatic flexure to the liver from her previous cholecystectomy.  Identified the terminal ileum.  She had a lot of small bowel proximal to this fixated in the pelvis from her previous hysterectomy.  Despite extensive lysis of adhesions I was unable to free the small bowel from the pelvis safely.  I mobilized the entire right colon along the white line of Toldt and was able to take down the hepatic flexure with the harmonic scalpel.  At this point, with the inability to free the bowel by the pelvis and the worried that there could be an enterotomy from the Optiview camera the decision was made to convert to an open procedure.  I removed all trochars and deflated the abdomen.  I then created midline incision with a scalpel.  I then dissected down through the subtenons tissue to the fascia which was then opened the entire length of the incision.  The patient again had multiple loops of small bowel stuck in the pelvis to the hernia reflection and bladder.  I was able to  take these down with the Metzenbaum scissors and finally free the small bowel up from out of the pelvis.  I then controlled bleeding in the colonic mesentery and colon sidewall with several interrupted silk sutures.  At this point I then evaluated again the area of the Optiview trocar in the left upper quadrant.  I could see a small amount of bile staining.  Upon further investigating I did find a small enterotomy in the jejunum just distal to ligament of Treitz.  There was also a small serosal tear.  I repaired these with interrupted 3-0 silk sutures.  I evaluate the colon in this area and the rest of the  small bowel and saw no other evidence of enterotomies.  The stomach also appeared normal.  This point I elected to have anesthesia place a nasogastric tube.  With the colon further mobilized I then transected the transverse colon with a GIA-75 stapler.  I likewise transected the distal ileum with a GIA-75 stapler as well.  I then took down the mesentery of the right colon with the LigaSure as well as 2-0 silk sutures.  The specimen was then sent to pathology for evaluation.  I could feel the polyp in the cecum.  Before performing the anastomosis I decided to resect several more inches of the distal small bowel as the mesentery seemed thin.  I did this again with another firing the GIA-75 stapler and the LigaSure.  The specimen was all sent to pathology.  I then reapproximated the small bowel to the transverse colon in a side-to-side fashion with interrupted silk sutures.  I then performed a colotomy and enterotomy with the cautery and then performed a transverse anastomosis with the GIA-75 stapler.  I then closed the opening with a TX 60 stapler.  I then reinforced the staple line and closed the mesentery with interrupted 2-0 and 3-0 silk sutures.  The anastomosis appeared pink and well perfused.  At this point we copiously irrigated the abdomen with several liters normal saline.  Hemostasis appeared to be achieved in the pelvis.  Again there was no other evidence of any bowel injury and the duodenal opening appeared to be intact.  I then closed the patient's midline fascia with a running #1 looped PDS suture.  I anesthetized the fascia circumferentially with Marcaine and Exparel.  I then irrigated the skin with saline and closed all incisions with staples.  Bandages were applied.  The patient tolerated procedure well.  All the counts were correct at the end of the procedure.  The patient was then extubated in the operating room and taken in a stable addition to the recovery room.

## 2020-03-05 ENCOUNTER — Encounter (HOSPITAL_COMMUNITY): Payer: Self-pay | Admitting: Surgery

## 2020-03-05 LAB — CBC
HCT: 28.7 % — ABNORMAL LOW (ref 36.0–46.0)
Hemoglobin: 9.6 g/dL — ABNORMAL LOW (ref 12.0–15.0)
MCH: 28.8 pg (ref 26.0–34.0)
MCHC: 33.4 g/dL (ref 30.0–36.0)
MCV: 86.2 fL (ref 80.0–100.0)
Platelets: 414 10*3/uL — ABNORMAL HIGH (ref 150–400)
RBC: 3.33 MIL/uL — ABNORMAL LOW (ref 3.87–5.11)
RDW: 13.7 % (ref 11.5–15.5)
WBC: 13.2 10*3/uL — ABNORMAL HIGH (ref 4.0–10.5)
nRBC: 0 % (ref 0.0–0.2)

## 2020-03-05 LAB — BASIC METABOLIC PANEL
Anion gap: 11 (ref 5–15)
BUN: 19 mg/dL (ref 8–23)
CO2: 21 mmol/L — ABNORMAL LOW (ref 22–32)
Calcium: 8.8 mg/dL — ABNORMAL LOW (ref 8.9–10.3)
Chloride: 107 mmol/L (ref 98–111)
Creatinine, Ser: 1.13 mg/dL — ABNORMAL HIGH (ref 0.44–1.00)
GFR calc Af Amer: 56 mL/min — ABNORMAL LOW (ref >60)
GFR calc non Af Amer: 49 mL/min — ABNORMAL LOW (ref >60)
Glucose, Bld: 173 mg/dL — ABNORMAL HIGH (ref 70–99)
Potassium: 3.9 mmol/L (ref 3.5–5.1)
Sodium: 139 mmol/L (ref 135–145)

## 2020-03-05 MED ORDER — CHLORHEXIDINE GLUCONATE CLOTH 2 % EX PADS
6.0000 | MEDICATED_PAD | Freq: Every day | CUTANEOUS | Status: DC
  Administered 2020-03-05: 6 via TOPICAL

## 2020-03-05 NOTE — Plan of Care (Signed)
  Problem: Education: Goal: Required Educational Video(s) Outcome: Progressing   Problem: Clinical Measurements: Goal: Ability to maintain clinical measurements within normal limits will improve Outcome: Progressing Goal: Postoperative complications will be avoided or minimized Outcome: Progressing   Problem: Skin Integrity: Goal: Demonstration of wound healing without infection will improve Outcome: Progressing   

## 2020-03-05 NOTE — Progress Notes (Signed)
1 Day Post-Op   Subjective/Chief Complaint: Comfortable Pain controlled Already ambulating    Objective: Vital signs in last 24 hours: Temp:  [97.4 F (36.3 C)-99.5 F (37.5 C)] 99.5 F (37.5 C) (08/20 0536) Pulse Rate:  [76-98] 87 (08/20 0536) Resp:  [12-21] 18 (08/20 0536) BP: (87-149)/(63-93) 129/87 (08/20 0536) SpO2:  [98 %-100 %] 100 % (08/20 0536)    Intake/Output from previous day: 08/19 0701 - 08/20 0700 In: 3040 [I.V.:2840; IV Piggyback:200] Out: 8616 [Urine:1325; Emesis/NG output:200; Blood:150] Intake/Output this shift: No intake/output data recorded.  Exam: Awake and alert Comfortable in appearance Abdomen soft, non-distended, dressing dry  Lab Results:  Recent Labs    03/05/20 0428  WBC 13.2*  HGB 9.6*  HCT 28.7*  PLT 414*   BMET Recent Labs    03/05/20 0428  NA 139  K 3.9  CL 107  CO2 21*  GLUCOSE 173*  BUN 19  CREATININE 1.13*  CALCIUM 8.8*   PT/INR No results for input(s): LABPROT, INR in the last 72 hours. ABG No results for input(s): PHART, HCO3 in the last 72 hours.  Invalid input(s): PCO2, PO2  Studies/Results: No results found.  Anti-infectives: Anti-infectives (From admission, onward)   Start     Dose/Rate Route Frequency Ordered Stop   03/04/20 2130  cefoTEtan (CEFOTAN) 2 g in sodium chloride 0.9 % 100 mL IVPB        2 g 200 mL/hr over 30 Minutes Intravenous Every 12 hours 03/04/20 1346 03/05/20 2159   03/04/20 0715  cefoTEtan (CEFOTAN) 2 g in sodium chloride 0.9 % 100 mL IVPB        2 g 200 mL/hr over 30 Minutes Intravenous On call to O.R. 03/04/20 [id number] 03/04/20 0952   03/04/20 0712  sodium chloride 0.9 % with cefoTEtan (CEFOTAN) ADS Med       Note to Pharmacy: Kyra Leyland   : cabinet override      03/04/20 0712 03/04/20 0932      Assessment/Plan: s/p Procedure(s): LAPAROSCOPIC ASISTED PARTIAL COLECTOMY (N/A)   Given proximal enterotomy, will leave NG in today Keep foley until tomorrow Ambulate   LOS:  1 day    Coralie Keens 03/05/2020

## 2020-03-06 LAB — BASIC METABOLIC PANEL
Anion gap: 7 (ref 5–15)
BUN: 15 mg/dL (ref 8–23)
CO2: 23 mmol/L (ref 22–32)
Calcium: 8.6 mg/dL — ABNORMAL LOW (ref 8.9–10.3)
Chloride: 110 mmol/L (ref 98–111)
Creatinine, Ser: 0.84 mg/dL (ref 0.44–1.00)
GFR calc Af Amer: >60 mL/min (ref >60)
GFR calc non Af Amer: >60 mL/min (ref >60)
Glucose, Bld: 117 mg/dL — ABNORMAL HIGH (ref 70–99)
Potassium: 3.9 mmol/L (ref 3.5–5.1)
Sodium: 140 mmol/L (ref 135–145)

## 2020-03-06 LAB — CBC
HCT: 25.2 % — ABNORMAL LOW (ref 36.0–46.0)
Hemoglobin: 8 g/dL — ABNORMAL LOW (ref 12.0–15.0)
MCH: 28.4 pg (ref 26.0–34.0)
MCHC: 31.7 g/dL (ref 30.0–36.0)
MCV: 89.4 fL (ref 80.0–100.0)
Platelets: 341 10*3/uL (ref 150–400)
RBC: 2.82 MIL/uL — ABNORMAL LOW (ref 3.87–5.11)
RDW: 14.1 % (ref 11.5–15.5)
WBC: 13.2 10*3/uL — ABNORMAL HIGH (ref 4.0–10.5)
nRBC: 0 % (ref 0.0–0.2)

## 2020-03-06 MED ORDER — OXYCODONE HCL 5 MG PO TABS: 5.0000 mg | ORAL_TABLET | ORAL | Status: DC | PRN

## 2020-03-06 MED ORDER — KETOROLAC TROMETHAMINE 15 MG/ML IJ SOLN
15.0000 mg | Freq: Three times a day (TID) | INTRAMUSCULAR | Status: AC
  Administered 2020-03-06 – 2020-03-08 (×9): 15 mg via INTRAVENOUS
  Filled 2020-03-06 (×9): qty 1

## 2020-03-06 MED ORDER — HYDROMORPHONE HCL 1 MG/ML IJ SOLN
1.0000 mg | INTRAMUSCULAR | Status: DC | PRN
  Filled 2020-03-06: qty 1

## 2020-03-06 NOTE — Progress Notes (Signed)
Progress Note: General Surgery Service   Chief Complaint/Subjective: Ambulating in hall, no nausea, no flatus, pain controlled  Objective: Vital signs in last 24 hours: Temp:  [98.5 F (36.9 C)-99 F (37.2 C)] 98.5 F (36.9 C) (08/21 0543) Pulse Rate:  [80-99] 99 (08/21 0543) Resp:  [15-17] 17 (08/21 0812) BP: (140-153)/(84-90) 140/87 (08/21 0543) SpO2:  [98 %-100 %] 98 % (08/21 0812)    Intake/Output from previous day: 08/20 0701 - 08/21 0700 In: 1495.4 [P.O.:120; I.V.:1375.4] Out: 1700 [Urine:1300; Emesis/NG output:400] Intake/Output this shift: Total I/O In: 60 [P.O.:60] Out: -   Gen: NAD  Resp: nonlabored  Card: RRR  Abd: soft, incision c/d/i, nondistended, nontender  Lab Results: CBC  Recent Labs    03/05/20 0428 03/06/20 0540  WBC 13.2* 13.2*  HGB 9.6* 8.0*  HCT 28.7* 25.2*  PLT 414* 341   BMET Recent Labs    03/05/20 0428 03/06/20 0540  NA 139 140  K 3.9 3.9  CL 107 110  CO2 21* 23  GLUCOSE 173* 117*  BUN 19 15  CREATININE 1.13* 0.84  CALCIUM 8.8* 8.6*   PT/INR No results for input(s): LABPROT, INR in the last 72 hours. ABG No results for input(s): PHART, HCO3 in the last 72 hours.  Invalid input(s): PCO2, PO2  Anti-infectives: Anti-infectives (From admission, onward)   Start     Dose/Rate Route Frequency Ordered Stop   03/04/20 2130  cefoTEtan (CEFOTAN) 2 g in sodium chloride 0.9 % 100 mL IVPB        2 g 200 mL/hr over 30 Minutes Intravenous Every 12 hours 03/04/20 1346 03/05/20 1037   03/04/20 0715  cefoTEtan (CEFOTAN) 2 g in sodium chloride 0.9 % 100 mL IVPB        2 g 200 mL/hr over 30 Minutes Intravenous On call to O.R. 03/04/20 8590 03/04/20 0952   03/04/20 0712  sodium chloride 0.9 % with cefoTEtan (CEFOTAN) ADS Med       Note to Pharmacy: Kyra Leyland   : cabinet override      03/04/20 0712 03/04/20 0932      Medications: Scheduled Meds: . Chlorhexidine Gluconate Cloth  6 each Topical Daily  . enoxaparin (LOVENOX)  injection  40 mg Subcutaneous Q24H  . lisinopril  20 mg Oral QPM   And  . hydrochlorothiazide  12.5 mg Oral QPM  . ketorolac  15 mg Intravenous Q8H  . methimazole  2.5 mg Oral Q0600   Continuous Infusions: . 0.9 % NaCl with KCl 20 mEq / L 100 mL/hr at 03/06/20 0537   PRN Meds:.diphenhydrAMINE **OR** diphenhydrAMINE, HYDROmorphone (DILAUDID) injection, ondansetron **OR** ondansetron (ZOFRAN) IV, oxyCODONE  Assessment/Plan: s/p Procedure(s): LAPAROSCOPIC ASISTED PARTIAL COLECTOMY 03/04/2020 NG 400 in 24 h, no signs of obstruction or ileus. She is progressing expectedly -remove NG tube -remove foley -switch from pca to PO with IV and multimodal approach -continue ambulate -wean to room air    LOS: 2 days   Mickeal Skinner, MD [facility]

## 2020-03-07 LAB — BASIC METABOLIC PANEL
Anion gap: 8 (ref 5–15)
BUN: 21 mg/dL (ref 8–23)
CO2: 22 mmol/L (ref 22–32)
Calcium: 8.7 mg/dL — ABNORMAL LOW (ref 8.9–10.3)
Chloride: 112 mmol/L — ABNORMAL HIGH (ref 98–111)
Creatinine, Ser: 0.99 mg/dL (ref 0.44–1.00)
GFR calc Af Amer: >60 mL/min (ref >60)
GFR calc non Af Amer: 57 mL/min — ABNORMAL LOW (ref >60)
Glucose, Bld: 121 mg/dL — ABNORMAL HIGH (ref 70–99)
Potassium: 3.8 mmol/L (ref 3.5–5.1)
Sodium: 142 mmol/L (ref 135–145)

## 2020-03-07 LAB — CBC
HCT: 21.7 % — ABNORMAL LOW (ref 36.0–46.0)
Hemoglobin: 7 g/dL — ABNORMAL LOW (ref 12.0–15.0)
MCH: 28.9 pg (ref 26.0–34.0)
MCHC: 32.3 g/dL (ref 30.0–36.0)
MCV: 89.7 fL (ref 80.0–100.0)
Platelets: 319 10*3/uL (ref 150–400)
RBC: 2.42 MIL/uL — ABNORMAL LOW (ref 3.87–5.11)
RDW: 14.4 % (ref 11.5–15.5)
WBC: 10 10*3/uL (ref 4.0–10.5)
nRBC: 0 % (ref 0.0–0.2)

## 2020-03-07 MED ORDER — LOPERAMIDE HCL 2 MG PO CAPS
2.0000 mg | ORAL_CAPSULE | ORAL | Status: DC | PRN
  Administered 2020-03-07 – 2020-03-09 (×6): 2 mg via ORAL
  Filled 2020-03-07 (×6): qty 1

## 2020-03-07 MED ORDER — TRAMADOL HCL 50 MG PO TABS
50.0000 mg | ORAL_TABLET | Freq: Four times a day (QID) | ORAL | Status: DC | PRN
  Administered 2020-03-07 – 2020-03-09 (×3): 100 mg via ORAL
  Filled 2020-03-07 (×3): qty 2

## 2020-03-07 MED ORDER — HYDROCORTISONE (PERIANAL) 2.5 % EX CREA
TOPICAL_CREAM | Freq: Three times a day (TID) | CUTANEOUS | Status: DC
  Administered 2020-03-09: 1 via RECTAL
  Filled 2020-03-07: qty 28.35

## 2020-03-07 NOTE — Progress Notes (Signed)
Progress Note: General Surgery Service   Chief Complaint/Subjective: Ambulating in hall, no nausea, having flatus and diarrhea, pain controlled  Objective: Vital signs in last 24 hours: Temp:  [97.7 F (36.5 C)-98.7 F (37.1 C)] 98.7 F (37.1 C) (08/22 0500) Pulse Rate:  [84-92] 85 (08/22 0500) Resp:  [16] 16 (08/22 0500) BP: (123-147)/(72-84) 124/72 (08/22 0500) SpO2:  [99 %-100 %] 100 % (08/22 0500) Last BM Date: 03/07/20  Intake/Output from previous day: 08/21 0701 - 08/22 0700 In: 3000 [P.O.:600; I.V.:2400] Out: 629 [Urine:626; Stool:3] Intake/Output this shift: No intake/output data recorded.  Gen: NAD  Resp: nonlabored  Card: RRR  Abd: soft, incision c/d/i, nondistended, nontender  Lab Results: CBC  Recent Labs    03/06/20 0540 03/07/20 0548  WBC 13.2* 10.0  HGB 8.0* 7.0*  HCT 25.2* 21.7*  PLT 341 319   BMET Recent Labs    03/06/20 0540 03/07/20 0548  NA 140 142  K 3.9 3.8  CL 110 112*  CO2 23 22  GLUCOSE 117* 121*  BUN 15 21  CREATININE 0.84 0.99  CALCIUM 8.6* 8.7*   PT/INR No results for input(s): LABPROT, INR in the last 72 hours. ABG No results for input(s): PHART, HCO3 in the last 72 hours.  Invalid input(s): PCO2, PO2  Anti-infectives: Anti-infectives (From admission, onward)   Start     Dose/Rate Route Frequency Ordered Stop   03/04/20 2130  cefoTEtan (CEFOTAN) 2 g in sodium chloride 0.9 % 100 mL IVPB        2 g 200 mL/hr over 30 Minutes Intravenous Every 12 hours 03/04/20 1346 03/05/20 1037   03/04/20 0715  cefoTEtan (CEFOTAN) 2 g in sodium chloride 0.9 % 100 mL IVPB        2 g 200 mL/hr over 30 Minutes Intravenous On call to O.R. 03/04/20 7017 03/04/20 0952   03/04/20 0712  sodium chloride 0.9 % with cefoTEtan (CEFOTAN) ADS Med       Note to Pharmacy: Kyra Leyland   : cabinet override      03/04/20 0712 03/04/20 0932      Medications: Scheduled Meds: . Chlorhexidine Gluconate Cloth  6 each Topical Daily  .  enoxaparin (LOVENOX) injection  40 mg Subcutaneous Q24H  . lisinopril  20 mg Oral QPM   And  . hydrochlorothiazide  12.5 mg Oral QPM  . ketorolac  15 mg Intravenous Q8H  . methimazole  2.5 mg Oral Q0600   Continuous Infusions:  PRN Meds:.diphenhydrAMINE **OR** diphenhydrAMINE, HYDROmorphone (DILAUDID) injection, loperamide, ondansetron **OR** ondansetron (ZOFRAN) IV, oxyCODONE, traMADol  Assessment/Plan: s/p Procedure(s): LAPAROSCOPIC ASISTED PARTIAL COLECTOMY 03/04/2020 -advance diet -saline lock IV -continue ambulate -Hgb down today, recheck in AM    LOS: 3 days   Rosario Adie, MD [phone] [facility]

## 2020-03-08 LAB — SURGICAL PATHOLOGY

## 2020-03-08 LAB — BASIC METABOLIC PANEL
Anion gap: 8 (ref 5–15)
BUN: 23 mg/dL (ref 8–23)
CO2: 22 mmol/L (ref 22–32)
Calcium: 8.4 mg/dL — ABNORMAL LOW (ref 8.9–10.3)
Chloride: 111 mmol/L (ref 98–111)
Creatinine, Ser: 1.02 mg/dL — ABNORMAL HIGH (ref 0.44–1.00)
GFR calc Af Amer: >60 mL/min (ref >60)
GFR calc non Af Amer: 55 mL/min — ABNORMAL LOW (ref >60)
Glucose, Bld: 100 mg/dL — ABNORMAL HIGH (ref 70–99)
Potassium: 3.6 mmol/L (ref 3.5–5.1)
Sodium: 141 mmol/L (ref 135–145)

## 2020-03-08 LAB — CBC
HCT: 19.4 % — ABNORMAL LOW (ref 36.0–46.0)
Hemoglobin: 6.1 g/dL — CL (ref 12.0–15.0)
MCH: 28.1 pg (ref 26.0–34.0)
MCHC: 31.4 g/dL (ref 30.0–36.0)
MCV: 89.4 fL (ref 80.0–100.0)
Platelets: 326 10*3/uL (ref 150–400)
RBC: 2.17 MIL/uL — ABNORMAL LOW (ref 3.87–5.11)
RDW: 14.3 % (ref 11.5–15.5)
WBC: 7 10*3/uL (ref 4.0–10.5)
nRBC: 0 % (ref 0.0–0.2)

## 2020-03-08 LAB — PREPARE RBC (CROSSMATCH)

## 2020-03-08 LAB — ABO/RH: ABO/RH(D): A POS

## 2020-03-08 MED ORDER — SODIUM CHLORIDE 0.9% IV SOLUTION: Freq: Once | INTRAVENOUS | Status: DC

## 2020-03-08 NOTE — Progress Notes (Signed)
CRITICAL VALUE ALERT  Critical Value: Hgb 6.1  Date & Time Notied: 03/08/2020 @0556   Provider Notified: Leighton Ruff  Orders Received/Actions taken: None

## 2020-03-08 NOTE — Progress Notes (Signed)
4 Days Post-Op   Subjective/Chief Complaint: Feels well Having BM's and tolerating po No gross blood in the stool   Objective: Vital signs in last 24 hours: Temp:  [97.9 F (36.6 C)-98.7 F (37.1 C)] 98.7 F (37.1 C) (08/23 0559) Pulse Rate:  [71-91] 71 (08/23 0559) Resp:  [16-17] 16 (08/23 0559) BP: (99-107)/(59-69) 102/59 (08/23 0559) SpO2:  [100 %] 100 % (08/23 0559) Last BM Date: 03/07/20  Intake/Output from previous day: 08/22 0701 - 08/23 0700 In: 1000 [P.O.:600; I.V.:400] Out: -  Intake/Output this shift: Total I/O In: 120 [P.O.:120] Out: -   Exam: Awake and alert Looks comfortable  Abdomen soft, incision clean, non-distended, minimally tender  Lab Results:  Recent Labs    03/07/20 0548 03/08/20 0430  WBC 10.0 7.0  HGB 7.0* 6.1*  HCT 21.7* 19.4*  PLT 319 326   BMET Recent Labs    03/06/20 0540 03/07/20 0548  NA 140 142  K 3.9 3.8  CL 110 112*  CO2 23 22  GLUCOSE 117* 121*  BUN 15 21  CREATININE 0.84 0.99  CALCIUM 8.6* 8.7*   PT/INR No results for input(s): LABPROT, INR in the last 72 hours. ABG No results for input(s): PHART, HCO3 in the last 72 hours.  Invalid input(s): PCO2, PO2  Studies/Results: No results found.  Anti-infectives: Anti-infectives (From admission, onward)   Start     Dose/Rate Route Frequency Ordered Stop   03/04/20 2130  cefoTEtan (CEFOTAN) 2 g in sodium chloride 0.9 % 100 mL IVPB        2 g 200 mL/hr over 30 Minutes Intravenous Every 12 hours 03/04/20 1346 03/05/20 1037   03/04/20 0715  cefoTEtan (CEFOTAN) 2 g in sodium chloride 0.9 % 100 mL IVPB        2 g 200 mL/hr over 30 Minutes Intravenous On call to O.R. 03/04/20 6415 03/04/20 0952   03/04/20 0712  sodium chloride 0.9 % with cefoTEtan (CEFOTAN) ADS Med       Note to Pharmacy: Kyra Leyland   : cabinet override      03/04/20 0712 03/04/20 0932      Assessment/Plan: s/p Procedure(s): LAPAROSCOPIC ASISTED PARTIAL COLECTOMY (N/A)  Acute post op  blood loss anemia. Suspect from anastomosis or slow ooze in the pelvis from lysis of adhesions  Transfuse 2 units PRBCs today Repeat labs in the morning   LOS: 4 days    Coralie Keens 03/08/2020

## 2020-03-08 NOTE — Care Management Important Message (Signed)
Important Message  Patient Details IM Letter given to the Patient Name: Gina Bryan MRN: [id number] Date of Birth: Mar 16, 1946   Medicare Important Message Given:  Yes     Kerin Salen 03/08/2020, 11:18 AM

## 2020-03-08 NOTE — Progress Notes (Signed)
MD Marcello Moores notified of patient hemoglobin being 6.1. Verbal order by MD Leighton Ruff for type and screen and to transfuse one unit of blood and have one other unit on hold.

## 2020-03-09 LAB — TYPE AND SCREEN
ABO/RH(D): A POS
Antibody Screen: NEGATIVE
Unit division: 0
Unit division: 0

## 2020-03-09 LAB — CBC
HCT: 25.8 % — ABNORMAL LOW (ref 36.0–46.0)
Hemoglobin: 8.7 g/dL — ABNORMAL LOW (ref 12.0–15.0)
MCH: 29.4 pg (ref 26.0–34.0)
MCHC: 33.7 g/dL (ref 30.0–36.0)
MCV: 87.2 fL (ref 80.0–100.0)
Platelets: 334 10*3/uL (ref 150–400)
RBC: 2.96 MIL/uL — ABNORMAL LOW (ref 3.87–5.11)
RDW: 14.5 % (ref 11.5–15.5)
WBC: 6.4 10*3/uL (ref 4.0–10.5)
nRBC: 0 % (ref 0.0–0.2)

## 2020-03-09 LAB — BPAM RBC
Blood Product Expiration Date: 202109152359
Blood Product Expiration Date: 202109152359
ISSUE DATE / TIME: 202108230937
ISSUE DATE / TIME: 202108231603
Unit Type and Rh: 6200
Unit Type and Rh: 6200

## 2020-03-09 MED ORDER — OXYCODONE HCL 5 MG PO TABS
5.0000 mg | ORAL_TABLET | ORAL | 0 refills | Status: AC | PRN
Start: 2020-03-09 — End: ?

## 2020-03-09 MED FILL — oxyCODONE HCL 5 MG TABS: 5 | 4 days supply | Qty: 20 | Fill #0

## 2020-03-09 NOTE — Progress Notes (Signed)
Patient ID: Gina Bryan, female   DOB: 09-26-1945, 74 y.o.   MRN: [id number]   No complaints Tolerating po and having BM's vss HGB up post transfusion Abdomen soft, incision clean  Plan: Discharge this evening

## 2020-03-09 NOTE — Discharge Summary (Signed)
Physician Discharge Summary  Patient ID: Gina Bryan MRN: [id number] DOB/AGE: 08-11-1945 74 y.o.  Admit date: 03/04/2020 Discharge date: 03/09/2020  Admission Diagnoses:  Discharge Diagnoses:  Active Problems:   Villous adenoma of colon post op blood loss anemia  Discharged Condition: good  Hospital Course: underwent elective colon resection with lysis of adhesions and repair of enterotomy.  NG removed POD#2 as well as foley.  Hgb drifted down suspected to be from anastomosis.  Transfused 2 units PRBC's and responded well.  Bowel function returned, pain controlled.   Discharged home POD#5  Consults: None  Significant Diagnostic Studies:   Treatments: surgery: lap converted to open right ileocolectomy, repair of enterotomy, lysis of adhesions  Discharge Exam: Blood pressure (!) 128/91, pulse 76, temperature 98.3 F (36.8 C), temperature source Oral, resp. rate 18, height 5\' 6"  (1.676 m), weight 74.4 kg, SpO2 98 %. General appearance: alert, cooperative and no distress Resp: clear to auscultation bilaterally Cardio: regular rate and rhythm, S1, S2 normal, no murmur, click, rub or gallop Incision/Wound:abdomen soft, incision with ecchymosis  Disposition: Discharge disposition: 01-Home or Self Care        Allergies as of 03/09/2020      Reactions   Codeine Nausea And Vomiting   Naproxen Hives, Itching, Rash      Medication List    TAKE these medications   acetaminophen 500 MG tablet Commonly known as: TYLENOL Take 1,000 mg by mouth every 6 (six) hours as needed for moderate pain.   Kaopectate 262 MG/15ML suspension Generic drug: bismuth subsalicylate Take 30 mLs by mouth every 6 (six) hours as needed for indigestion or diarrhea or loose stools.   lisinopril-hydrochlorothiazide 20-12.5 MG tablet Commonly known as: ZESTORETIC Take 1 tablet by mouth every evening.   methimazole 5 MG tablet Commonly known as: TAPAZOLE Take 2.5 mg by mouth daily at 6 (six) AM.    oxyCODONE 5 MG immediate release tablet Commonly known as: Oxy IR/ROXICODONE Take 1 tablet (5 mg total) by mouth every 4 (four) hours as needed for moderate pain.       Follow-up Information    Coralie Keens, MD. Schedule an appointment as soon as possible for a visit in 1 week(s).   Specialty: General Surgery Contact information: [address] [place] [address] [phone]               Signed: Coralie Keens 03/09/2020, 8:56 AM

## 2020-03-09 NOTE — Discharge Instructions (Signed)
[facility], [place] [phone]  OPEN ABDOMINAL SURGERY: POST OP INSTRUCTIONS  Always review your discharge instruction sheet given to you by the facility where your surgery was performed.  IF YOU HAVE DISABILITY OR FAMILY LEAVE FORMS, YOU MUST BRING THEM TO THE OFFICE FOR PROCESSING.  PLEASE DO NOT GIVE THEM TO YOUR DOCTOR.  1. A prescription for pain medication may be given to you upon discharge.  Take your pain medication as prescribed, if needed.  If narcotic pain medicine is not needed, then you may take acetaminophen (Tylenol) or ibuprofen (Advil) as needed. 2. Take your usually prescribed medications unless otherwise directed. 3. If you need a refill on your pain medication, please contact your pharmacy. They will contact our office to request authorization.  Prescriptions will not be filled after 5pm or on week-ends. 4. You should follow a light diet the first few days after arrival home, such as soup and crackers, pudding, etc.unless your doctor has advised otherwise. A high-fiber, low fat diet can be resumed as tolerated.   Be sure to include lots of fluids daily. Most patients will experience some swelling and bruising on the chest and neck area.  Ice packs will help.  Swelling and bruising can take several days to resolve 5. Most patients will experience some swelling and bruising in the area of the incision. Ice pack will help. Swelling and bruising can take several days to resolve..  6. It is common to experience some constipation if taking pain medication after surgery.  Increasing fluid intake and taking a stool softener will usually help or prevent this problem from occurring.  A mild laxative (Milk of Magnesia or Miralax) should be taken according to package directions if there are no bowel movements after 48 hours. 7.  You may have steri-strips (small skin tapes) in place directly over the incision.  These strips should be left on the skin for 7-10 days.  If your  surgeon used skin glue on the incision, you may shower in 24 hours.  The glue will flake off over the next 2-3 weeks.  Any sutures or staples will be removed at the office during your follow-up visit. You may find that a light gauze bandage over your incision may keep your staples from being rubbed or pulled. You may shower and replace the bandage daily. 8. ACTIVITIES:  You may resume regular (light) daily activities beginning the next day--such as daily self-care, walking, climbing stairs--gradually increasing activities as tolerated.  You may have sexual intercourse when it is comfortable.  Refrain from any heavy lifting or straining until approved by your doctor. a. You may drive when you no longer are taking prescription pain medication, you can comfortably wear a seatbelt, and you can safely maneuver your car and apply brakes b. Return to Work: ___________________________________ 28. You should see your doctor in the office for a follow-up appointment approximately two weeks after your surgery.  Make sure that you call for this appointment within a day or two after you arrive home to insure a convenient appointment time. OTHER INSTRUCTIONS: OK TO SHOWER _____________________________________________________________ _____________________________________________________________  WHEN TO CALL YOUR DOCTOR: 1. Fever over 101.0 2. Inability to urinate 3. Nausea and/or vomiting 4. Extreme swelling or bruising 5. Continued bleeding from incision. 6. Increased pain, redness, or drainage from the incision. 7. Difficulty swallowing or breathing 8. Muscle cramping or spasms. 9. Numbness or tingling in hands or feet or around lips.  The clinic staff is  available to answer your questions during regular business hours.  Please don't hesitate to call and ask to speak to one of the nurses if you have concerns.  For further questions, please visit [email or web address]

## 2020-03-09 NOTE — Plan of Care (Signed)
Patient's questions were answered and discharge instructions were given to patient. Patient was taken to main entrance in wheelchair.

## 2020-03-30 ENCOUNTER — Ambulatory Visit: Payer: Self-pay

## 2020-04-13 ENCOUNTER — Ambulatory Visit: Payer: Self-pay

## 2020-05-01 ENCOUNTER — Ambulatory Visit: Payer: Medicare Other | Attending: Internal Medicine

## 2020-05-01 DIAGNOSIS — Z23 Encounter for immunization: Secondary | ICD-10-CM

## 2020-05-01 NOTE — Progress Notes (Signed)
   Covid-19 Vaccination Clinic  Name:  Adana Marik    MRN: [id number] DOB: 11-07-1945  05/01/2020  Ms. Gleghorn was observed post Covid-19 immunization for 15 minutes without incident. She was provided with Vaccine Information Sheet and instruction to access the V-Safe system.   Ms. Sweeny was instructed to call 911 with any severe reactions post vaccine: Marland Kitchen Difficulty breathing  . Swelling of face and throat  . A fast heartbeat  . A bad rash all over body  . Dizziness and weakness

## 2020-05-11 ENCOUNTER — Ambulatory Visit: Payer: Medicare Other

## 2020-05-15 ENCOUNTER — Ambulatory Visit: Payer: Medicare Other

## 2020-11-24 ENCOUNTER — Other Ambulatory Visit: Payer: Self-pay | Admitting: Family Medicine

## 2020-11-24 DIAGNOSIS — Z1231 Encounter for screening mammogram for malignant neoplasm of breast: Secondary | ICD-10-CM

## 2021-02-03 ENCOUNTER — Ambulatory Visit: Payer: Medicare Other

## 2021-02-27 IMAGING — MG DIGITAL SCREENING BILAT W/ TOMO W/ CAD
8 series · 9 of 24 positions shown · non-contrast
Comparison: None.

CLINICAL DATA: Screening.

EXAM:
DIGITAL SCREENING BILATERAL MAMMOGRAM WITH TOMO AND CAD

[R CC synth-2D]
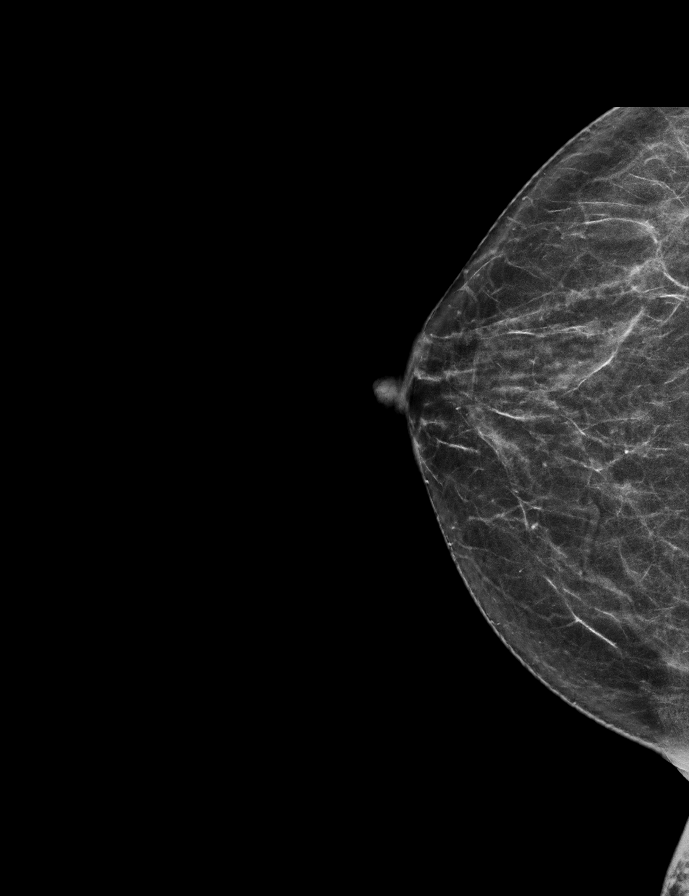

[L CC synth-2D]
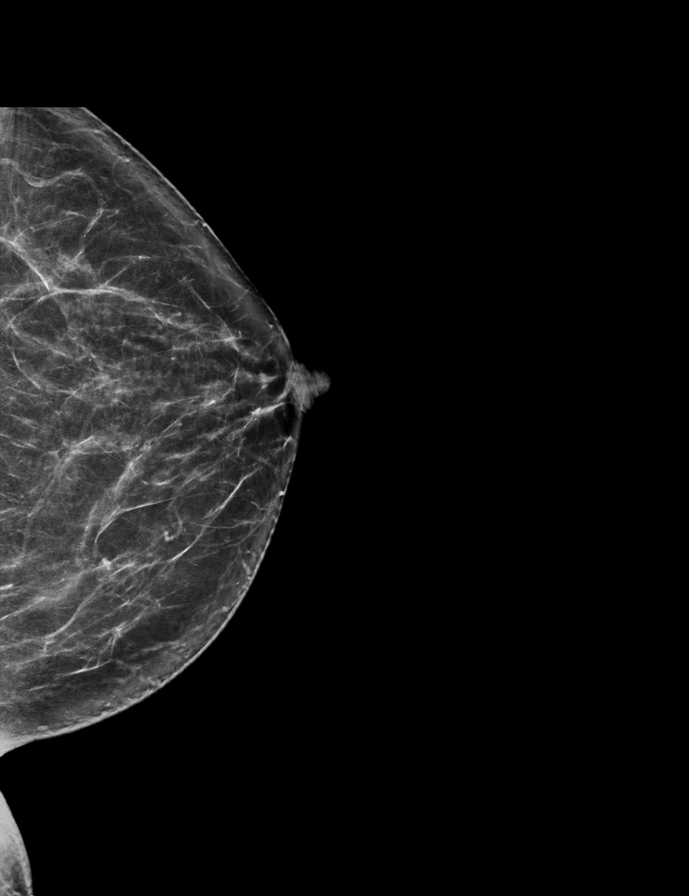

[L MLO synth-2D]
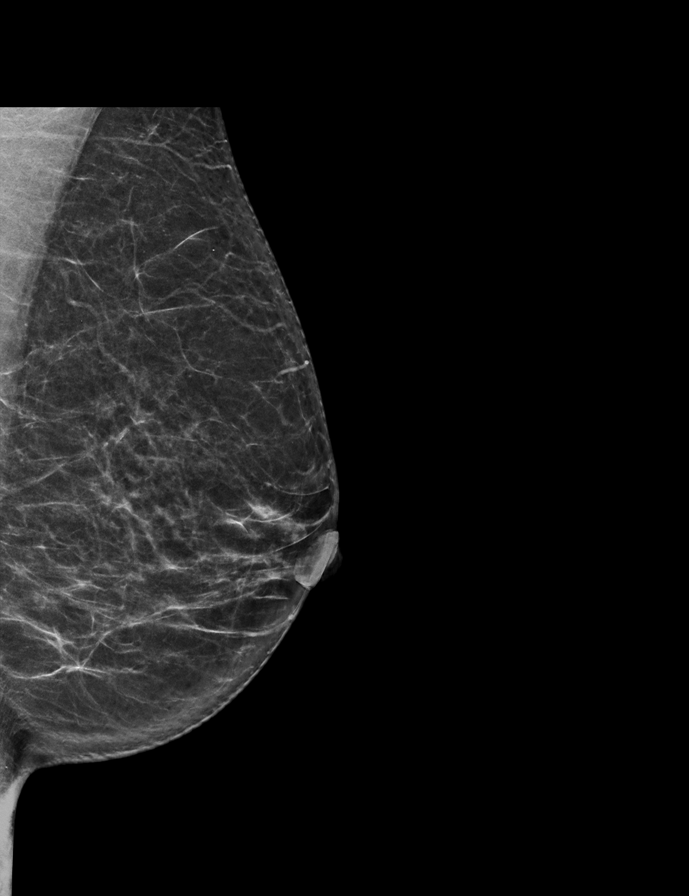

[R MLO synth-2D]
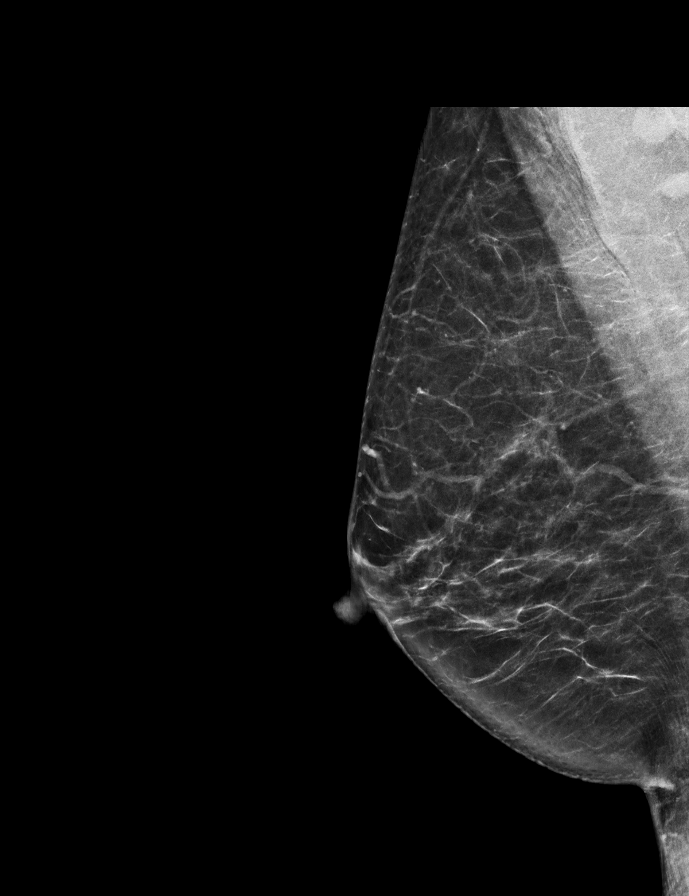

[L CC tomo · 2 of 70 frames shown]
[frame 23/70]
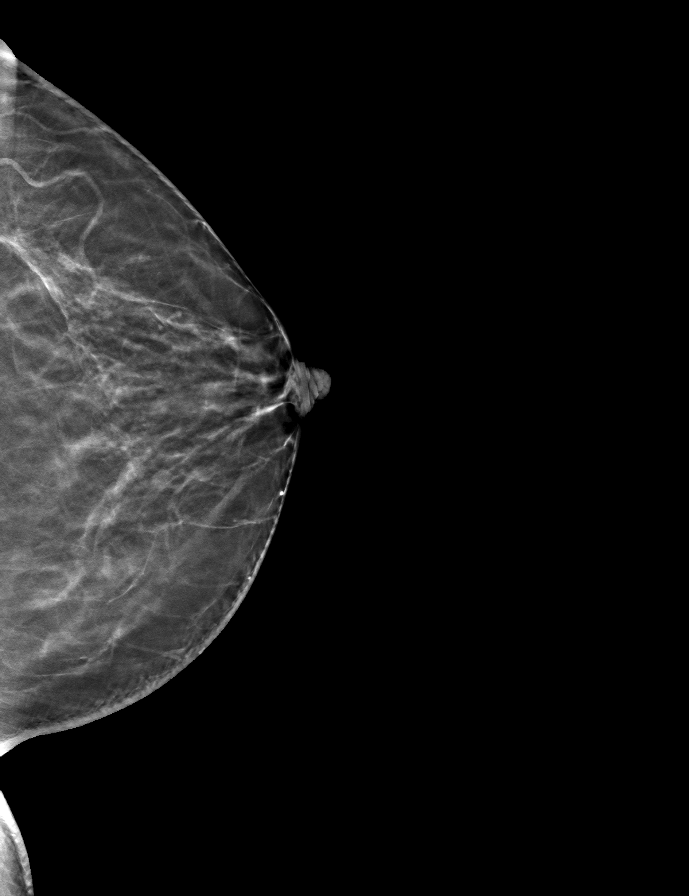
[frame 35/70]
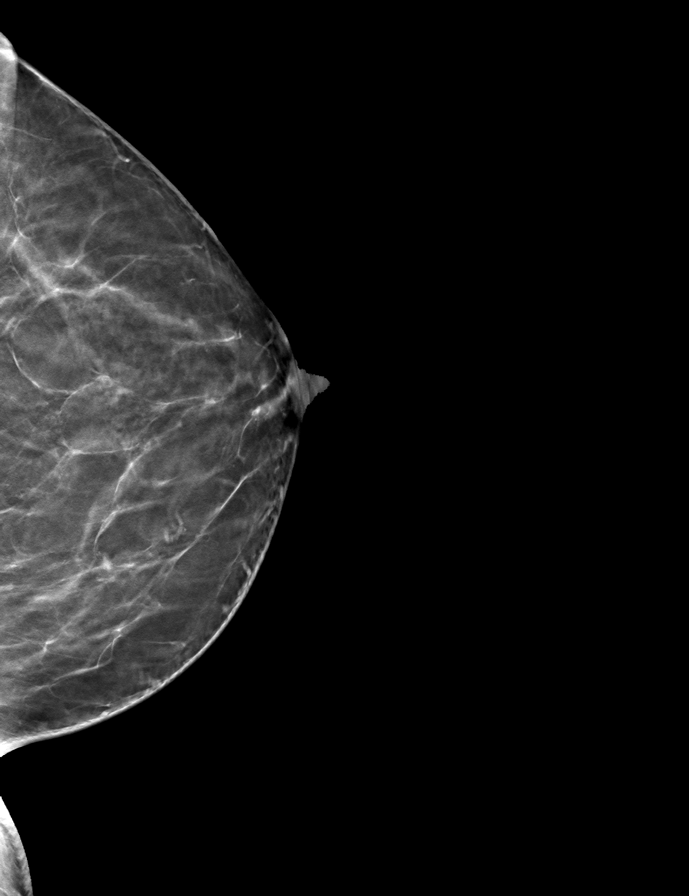

[R CC tomo · tomo slice 31/62.0]
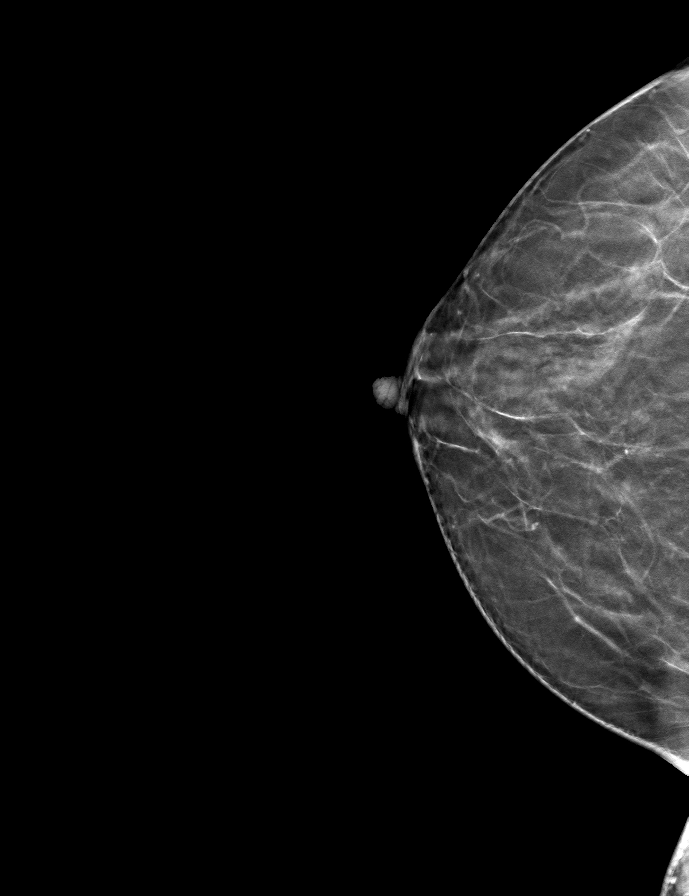

[R MLO tomo · tomo slice 34/67.0]
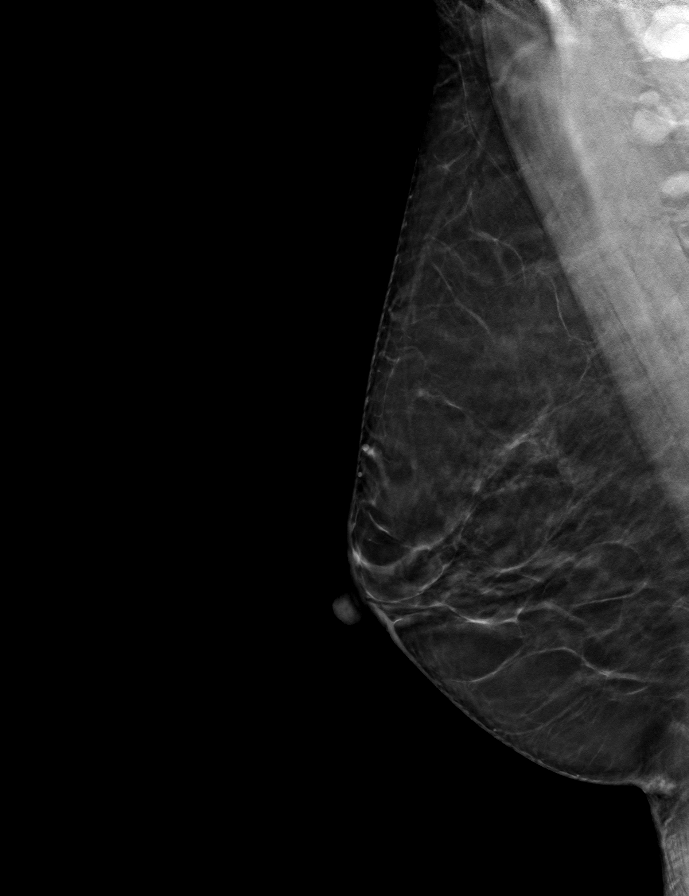

[L MLO tomo · tomo slice 33/66.0]
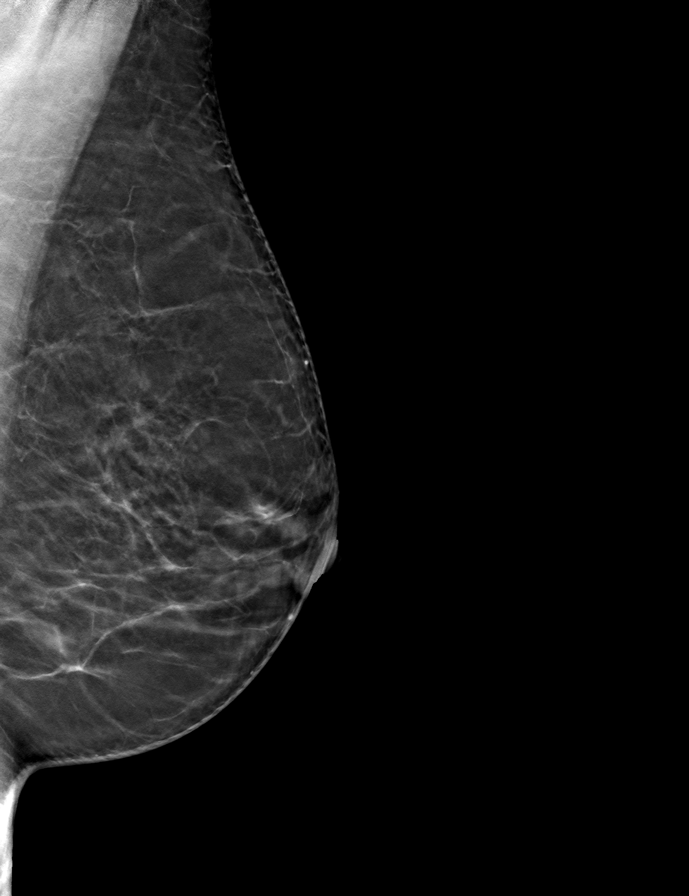

[9 of 24 positions shown; findings below may reference images not displayed]

ACR Breast Density Category b: There are scattered areas of
fibroglandular density.
FINDINGS: There are no findings suspicious for malignancy. Images were
processed with CAD.
IMPRESSION: No mammographic evidence of malignancy. A result letter of this
screening mammogram will be mailed directly to the patient.

RECOMMENDATION:
Screening mammogram in one year. (Code:[id number]-EJ6)

BI-RADS CATEGORY  1: Negative.

## 2021-04-06 ENCOUNTER — Other Ambulatory Visit: Payer: Self-pay

## 2021-04-06 ENCOUNTER — Ambulatory Visit
Admission: RE | Admit: 2021-04-06 | Discharge: 2021-04-06 | Disposition: A | Discharge status: Home / Self Care | Payer: Medicare Other | Source: Ambulatory Visit | Attending: Family Medicine | Admitting: Family Medicine

## 2021-04-06 DIAGNOSIS — Z1231 Encounter for screening mammogram for malignant neoplasm of breast: Secondary | ICD-10-CM

## 2022-03-10 ENCOUNTER — Other Ambulatory Visit: Payer: Self-pay | Admitting: Family Medicine

## 2022-03-10 DIAGNOSIS — Z1231 Encounter for screening mammogram for malignant neoplasm of breast: Secondary | ICD-10-CM

## 2022-05-11 ENCOUNTER — Ambulatory Visit: Payer: Medicare Other

## 2022-06-05 ENCOUNTER — Ambulatory Visit: Payer: Medicare Other

## 2022-08-02 ENCOUNTER — Ambulatory Visit
Admission: RE | Admit: 2022-08-02 | Discharge: 2022-08-02 | Disposition: A | Discharge status: Home / Self Care | Payer: Medicare Other | Source: Ambulatory Visit | Attending: Family Medicine | Admitting: Family Medicine

## 2022-08-02 DIAGNOSIS — Z1231 Encounter for screening mammogram for malignant neoplasm of breast: Secondary | ICD-10-CM

## 2023-06-19 ENCOUNTER — Other Ambulatory Visit: Payer: Self-pay | Admitting: Family Medicine

## 2023-06-19 DIAGNOSIS — Z1231 Encounter for screening mammogram for malignant neoplasm of breast: Secondary | ICD-10-CM

## 2023-09-13 ENCOUNTER — Ambulatory Visit
Admission: RE | Admit: 2023-09-13 | Discharge: 2023-09-13 | Disposition: A | Discharge status: Home / Self Care | Payer: Medicare Other | Source: Ambulatory Visit | Attending: Family Medicine | Admitting: Family Medicine

## 2023-09-13 DIAGNOSIS — Z1231 Encounter for screening mammogram for malignant neoplasm of breast: Secondary | ICD-10-CM
